# Patient Record
Sex: Male | Born: 1935 | Race: White | Marital: Married | Smoking: Never smoker
Health system: Southern US, Community
[De-identification: ages and names within clinical notes are randomized; demographics above are authoritative.]

## PROBLEM LIST (undated history)

## (undated) DIAGNOSIS — K5790 Diverticulosis of intestine, part unspecified, without perforation or abscess without bleeding: Secondary | ICD-10-CM

## (undated) DIAGNOSIS — H3530 Unspecified macular degeneration: Secondary | ICD-10-CM

## (undated) DIAGNOSIS — H8109 Meniere's disease, unspecified ear: Secondary | ICD-10-CM

## (undated) DIAGNOSIS — K2270 Barrett's esophagus without dysplasia: Secondary | ICD-10-CM

## (undated) DIAGNOSIS — K219 Gastro-esophageal reflux disease without esophagitis: Secondary | ICD-10-CM

## (undated) DIAGNOSIS — N419 Inflammatory disease of prostate, unspecified: Secondary | ICD-10-CM

## (undated) DIAGNOSIS — H269 Unspecified cataract: Secondary | ICD-10-CM

## (undated) DIAGNOSIS — K222 Esophageal obstruction: Secondary | ICD-10-CM

## (undated) DIAGNOSIS — I639 Cerebral infarction, unspecified: Secondary | ICD-10-CM

## (undated) DIAGNOSIS — H9319 Tinnitus, unspecified ear: Secondary | ICD-10-CM

## (undated) DIAGNOSIS — E785 Hyperlipidemia, unspecified: Secondary | ICD-10-CM

## (undated) DIAGNOSIS — H8309 Labyrinthitis, unspecified ear: Secondary | ICD-10-CM

## (undated) HISTORY — PX: TONSILLECTOMY: SUR1361

## (undated) HISTORY — DX: Unspecified macular degeneration: H35.30

## (undated) HISTORY — DX: Gastro-esophageal reflux disease without esophagitis: K21.9

## (undated) HISTORY — DX: Diverticulosis of intestine, part unspecified, without perforation or abscess without bleeding: K57.90

## (undated) HISTORY — DX: Labyrinthitis, unspecified ear: H83.09

## (undated) HISTORY — PX: COLONOSCOPY: SHX174

## (undated) HISTORY — DX: Meniere's disease, unspecified ear: H81.09

## (undated) HISTORY — PX: ESOPHAGOGASTRODUODENOSCOPY: SHX1529

## (undated) HISTORY — DX: Barrett's esophagus without dysplasia: K22.70

## (undated) HISTORY — DX: Hyperlipidemia, unspecified: E78.5

## (undated) HISTORY — DX: Inflammatory disease of prostate, unspecified: N41.9

## (undated) HISTORY — DX: Unspecified cataract: H26.9

## (undated) HISTORY — DX: Esophageal obstruction: K22.2

## (undated) HISTORY — DX: Cerebral infarction, unspecified: I63.9

## (undated) HISTORY — DX: Tinnitus, unspecified ear: H93.19

---

## 2017-10-14 ENCOUNTER — Encounter: Payer: Self-pay | Admitting: Internal Medicine

## 2017-11-26 ENCOUNTER — Encounter: Payer: Self-pay | Admitting: Internal Medicine

## 2017-11-26 ENCOUNTER — Ambulatory Visit (INDEPENDENT_AMBULATORY_CARE_PROVIDER_SITE_OTHER): Payer: Medicare Other | Admitting: Internal Medicine

## 2017-11-26 ENCOUNTER — Other Ambulatory Visit (INDEPENDENT_AMBULATORY_CARE_PROVIDER_SITE_OTHER): Payer: Medicare Other

## 2017-11-26 VITALS — BP 144/82 | HR 64 | Ht 68.25 in | Wt 174.0 lb

## 2017-11-26 DIAGNOSIS — G43A0 Cyclical vomiting, not intractable: Secondary | ICD-10-CM

## 2017-11-26 DIAGNOSIS — R29818 Other symptoms and signs involving the nervous system: Secondary | ICD-10-CM

## 2017-11-26 DIAGNOSIS — R2681 Unsteadiness on feet: Secondary | ICD-10-CM

## 2017-11-26 LAB — CBC WITH DIFFERENTIAL/PLATELET
Basophils Absolute: 0.1 10*3/uL (ref 0.0–0.1)
Basophils Relative: 1.3 % (ref 0.0–3.0)
Eosinophils Absolute: 0.7 10*3/uL (ref 0.0–0.7)
Eosinophils Relative: 7.6 % — ABNORMAL HIGH (ref 0.0–5.0)
HCT: 42.3 % (ref 39.0–52.0)
Hemoglobin: 14.7 g/dL (ref 13.0–17.0)
Lymphocytes Relative: 18.7 % (ref 12.0–46.0)
Lymphs Abs: 1.7 10*3/uL (ref 0.7–4.0)
MCHC: 34.7 g/dL (ref 30.0–36.0)
MCV: 90.6 fl (ref 78.0–100.0)
Monocytes Absolute: 0.6 10*3/uL (ref 0.1–1.0)
Monocytes Relative: 6.9 % (ref 3.0–12.0)
Neutro Abs: 6.1 10*3/uL (ref 1.4–7.7)
Neutrophils Relative %: 65.5 % (ref 43.0–77.0)
Platelets: 227 10*3/uL (ref 150.0–400.0)
RBC: 4.67 Mil/uL (ref 4.22–5.81)
RDW: 12.5 % (ref 11.5–15.5)
WBC: 9.3 10*3/uL (ref 4.0–10.5)

## 2017-11-26 LAB — COMPREHENSIVE METABOLIC PANEL
ALT: 13 U/L (ref 0–53)
AST: 14 U/L (ref 0–37)
Albumin: 4 g/dL (ref 3.5–5.2)
Alkaline Phosphatase: 72 U/L (ref 39–117)
BUN: 28 mg/dL — ABNORMAL HIGH (ref 6–23)
CO2: 32 mEq/L (ref 19–32)
Calcium: 9.8 mg/dL (ref 8.4–10.5)
Chloride: 101 mEq/L (ref 96–112)
Creatinine, Ser: 1.16 mg/dL (ref 0.40–1.50)
GFR: 63.69 mL/min (ref >60.00)
Glucose, Bld: 99 mg/dL (ref 70–99)
Potassium: 3.9 mEq/L (ref 3.5–5.1)
Sodium: 138 mEq/L (ref 135–145)
Total Bilirubin: 0.7 mg/dL (ref 0.2–1.2)
Total Protein: 7.1 g/dL (ref 6.0–8.3)

## 2017-11-26 NOTE — Patient Instructions (Addendum)
You have been scheduled for an endoscopy. Please follow written instructions given to you at your visit today. If you use inhalers (even only as needed), please bring them with you on the day of your procedure.   Your physician has requested that you go to the basement for the following lab work before leaving today: CBC/diff, CMET  You have been scheduled [facility] an [facility] on ____________. Your appointment time is ______________. Please arrive 20 minutes prior to your appointment time for registration purposes. Please make certain not to have anything to eat or drink 6 hours prior to your test. In addition, if you have any metal in your body, have a pacemaker or defibrillator, please be sure to let your ordering physician know. This test typically takes 45 minutes to 1 hour to complete. Should you need to reschedule, please call [phone] to do so.   I appreciate the opportunity to care for you. Stan Headarl Gessner, MD, [facility]   [facility] to reach out to patient to set up date/time.

## 2017-11-26 NOTE — Progress Notes (Signed)
Robert Gonzales 84 y.o. 08/17/1931 [id number]  Assessment & Plan:   Encounter Diagnoses  Name Primary?  . Cyclical vomiting with nausea, intractability of vomiting not specified Yes  . Gait instability   . Abnormal Romberg test   . Other symptoms and signs involving the nervous system     Suspect vomiting problems are not GI in origin but hx esophageal stricture and GERD with overall sxs - I think EGD reasonable.  Will also get MR/MRA brain to evaluate for vertebrobasilar insufficiency which may be a cause of episodic nausea and vomiting. Hx PAF - during cataract surgery - though Holter recently negative it is plausible that AF w/ decreased EF in setting of vertebrobasilar insufficiency could cause spells.  ENT does not think Meniere's cause (at least did not in 2017). W/ + romberg and gait instability ? If could have cerebellar problems  I appreciate the opportunity to care for this patient. CC: Clinton Sawyer, MD   Subjective:   Chief Complaint: Vomiting  HPI The patient is a very pleasant married 84 year old white man with his wife and daughter, [name] whom is a Engineer, civil (consulting) in our endoscopy center.  He has had chronic intermittent nausea and vomiting spells.  Originally thought to possibly have Mnire's, was treated with a steroid injection in the tympanic membrane at [facility] in 2007 and did well for a number of years.  Negative MR brain then. He is had about 12 spells of severe nausea and vomiting since December 2018 however.  In 2017 he saw ENT again and they thought he might have vertebrobasilar insufficiency.  He has not had testing done for that subsequently.  He was started on pentoxifylline .  He did have a recent Holter monitor and carotid ultrasound that were negative.  His daughter informs me that during cataract surgery in the past few years he did have an  episode of atrial fibrillation.  We do not have documentation that has happened since though she wonders.  He has a chronic  gait instability problem.  He says he has balance issues.No vertigo. Vision ok. Ears fell plugged up all the time. No dysphagia.No focal neuro complaints otherwise. Chronically hard of hearing.  He has a GI history with a question of Barrett's esophagus at one point he has had an esophageal stricture dilation before as well.  In 2014 he had an EGD and a 71 French Maloney dilation and no Barrett's esophagus as suspected previously.   Allergies  Allergen Reactions  . Sulfa Antibiotics Nausea Only   Current Meds  Medication Sig  . alum & mag hydroxide-simeth (MAALOX/MYLANTA) 200-200-20 MG/5ML suspension Take 30 mLs by mouth every 6 (six) hours as needed for indigestion or heartburn.  Marland Kitchen aspirin EC 81 MG tablet Take 1 tablet by mouth daily.  . cholecalciferol (VITAMIN D) 1000 units tablet Take 1 tablet by mouth daily.  . diazepam (VALIUM) 2 MG tablet Take by mouth.  . levocetirizine (XYZAL) 5 MG tablet Take 1 tablet by mouth daily.  Marland Kitchen omeprazole (PRILOSEC) 20 MG capsule Take 1 capsule by mouth daily.  . pentoxifylline (TRENTAL) 400 MG CR tablet Take 1 tablet by mouth 2 (two) times daily.  . promethazine (PHENERGAN) 25 MG tablet Take 25 mg by mouth every 6 (six) hours as needed for nausea or vomiting.  . vitamin B-12 (CYANOCOBALAMIN) 1000 MCG tablet Take 1 tablet by mouth daily.   Past Medical History:  Diagnosis Date  . Barrett's esophagus   . Diverticulosis  right and left colon  . Esophageal stricture    with Barrett's epithelium  . GERD (gastroesophageal reflux disease)   . Hearing loss   . Hyperlipidemia   . Labyrinthitis    recurrent  . Meniere disease   . Prostatitis   . Tinnitus    roaring   Past Surgical History:  Procedure Laterality Date  . COLONOSCOPY    . ESOPHAGOGASTRODUODENOSCOPY    . TONSILLECTOMY     Social History   Social History Narrative   Married, retired, 1 daughter - Ursula BeathKaren Tyrell, RN - [employer]   Worked for [employer]   None to rare EtOH   Not a smoker    family history includes Brain cancer in his brother; Healthy in his brother; Leukemia in his brother.   Review of Systems As per HPI  Objective:   Physical Exam @BP  (!) 144/82   Pulse 64   Ht 5' 8.25" (1.734 m)   Wt 174 lb (78.9 kg)   BMI 26.26 kg/m @  General:  Well-developed, well-nourished and in no acute distress Eyes:  anicteric. ENT:   Mouth and posterior pharynx free of lesions. + hearing aids and decreased hearing Neck:   supple w/o thyromegaly or mass.  Lungs: Clear to auscultation bilaterally. Heart:  S1S2, no rubs, murmurs, gallops. Abdomen:  soft, non-tender, no hepatosplenomegaly, hernia, or mass and BS+.  Lymph:  no cervical or supraclavicular adenopathy. Extremities:   no edema, cyanosis or clubbing Skin   no rash. Neuro:  A&O x 3. + Romberg, finger nose finger intact Psych:  appropriate mood and  Affect.   Data Reviewed: As per HPI I reviewed endoscopy notes, pathology reports and GI records from [place].  I reviewed [facility] notes and prior imaging .  Laboratory studies as well.  These will be scanned in.

## 2017-11-27 NOTE — Progress Notes (Signed)
I told daughter labs were ok

## 2017-12-16 ENCOUNTER — Ambulatory Visit
Admission: RE | Admit: 2017-12-16 | Discharge: 2017-12-16 | Disposition: A | Discharge status: Home / Self Care | Payer: Medicare Other | Source: Ambulatory Visit | Attending: Internal Medicine | Admitting: Internal Medicine

## 2017-12-16 DIAGNOSIS — R29818 Other symptoms and signs involving the nervous system: Secondary | ICD-10-CM

## 2017-12-16 MED ORDER — GADOBENATE DIMEGLUMINE 529 MG/ML IV SOLN
15.0000 mL | Freq: Once | INTRAVENOUS | Status: AC | PRN
  Administered 2017-12-16: 15 mL via INTRAVENOUS

## 2017-12-17 NOTE — Progress Notes (Signed)
Results given top daughter in person EGD is next (already scheduled)

## 2017-12-31 ENCOUNTER — Encounter: Payer: Self-pay | Admitting: Internal Medicine

## 2017-12-31 ENCOUNTER — Ambulatory Visit (AMBULATORY_SURGERY_CENTER): Payer: Medicare Other | Admitting: Internal Medicine

## 2017-12-31 ENCOUNTER — Other Ambulatory Visit: Payer: Self-pay

## 2017-12-31 VITALS — BP 141/79 | HR 54 | Temp 96.0°F | Resp 14 | Ht 68.25 in | Wt 174.0 lb

## 2017-12-31 DIAGNOSIS — G43A0 Cyclical vomiting, not intractable: Secondary | ICD-10-CM

## 2017-12-31 DIAGNOSIS — K259 Gastric ulcer, unspecified as acute or chronic, without hemorrhage or perforation: Secondary | ICD-10-CM | POA: Diagnosis not present

## 2017-12-31 DIAGNOSIS — K2960 Other gastritis without bleeding: Secondary | ICD-10-CM

## 2017-12-31 DIAGNOSIS — K2950 Unspecified chronic gastritis without bleeding: Secondary | ICD-10-CM

## 2017-12-31 MED ORDER — SODIUM CHLORIDE 0.9 % IV SOLN: 500.0000 mL | Freq: Once | INTRAVENOUS | Status: DC

## 2017-12-31 NOTE — Progress Notes (Signed)
Spontaneous respirations throughout. VSS. Resting comfortably. To PACU on room air. Report to  RN. 

## 2017-12-31 NOTE — Progress Notes (Signed)
Pt's esophagus was dilatated.  Per Dr. Leone PayorGessner no need for a dilatation diet. maw

## 2017-12-31 NOTE — Progress Notes (Signed)
Called to room to assist during endoscopic procedure.  Patient ID and intended procedure confirmed with present staff. Received instructions for my participation in the procedure from the performing physician.  

## 2017-12-31 NOTE — Progress Notes (Signed)
No problems noted in the recovery room. maw 

## 2017-12-31 NOTE — Op Note (Signed)
[name] Procedure Date: 12/31/2017 10:03 AM MRN: [id number] Endoscopist: Iva Booparl E Toshika Parrow , MD Age: 84 Referring MD:  Date of Birth: 10-Jan-1931 Gender: Male Account #: [id number] Procedure:                Upper GI endoscopy Indications:              Nausea with vomiting, Persistent vomiting of                            unknown cause Medicines:                Propofol per Anesthesia, Monitored Anesthesia Care Procedure:                Pre-Anesthesia Assessment:                           - Prior to the procedure, a History and Physical                            was performed, and patient medications and                            allergies were reviewed. The patient's tolerance of                            previous anesthesia was also reviewed. The risks                            and benefits of the procedure and the sedation                            options and risks were discussed with the patient.                            All questions were answered, and informed consent                            was obtained. Prior Anticoagulants: The patient has                            taken no previous anticoagulant or antiplatelet                            agents. ASA Grade Assessment: III - A patient with                            severe systemic disease. After reviewing the risks                            and benefits, the patient was deemed in                            satisfactory condition to undergo the procedure.  After obtaining informed consent, the endoscope was                            passed under direct vision. Throughout the                            procedure, the patient's blood pressure, pulse, and                            oxygen saturations were monitored continuously. The                            Endoscope was introduced through the mouth, and                            advanced to the  second part of duodenum. The upper                            GI endoscopy was accomplished without difficulty.                            The patient tolerated the procedure well. Scope In: Scope Out: Findings:                 One moderate benign-appearing, intrinsic stenosis                            was found at the gastroesophageal junction. And was                            traversed. A TTS dilator was passed through the                            scope. Dilation with an 18-19-20 mm balloon dilator                            was performed to 20 mm. The dilation site was                            examined and showed no change. Estimated blood                            loss: none.                           The Z-line was irregular and was found at the                            gastroesophageal junction.                           A 2 cm hiatal hernia was present.                           Multiple dispersed, small  non-bleeding erosions                            were found in the prepyloric region of the stomach.                            There were no stigmata of recent bleeding. Biopsies                            were taken with a cold forceps for histology.                            Verification of patient identification for the                            specimen was done. Estimated blood loss was minimal.                           A single 6 mm submucosal papule (nodule) was found                            in the gastric antrum. Biopsies were taken with a                            cold forceps for histology. Verification of patient                            identification for the specimen was done. Estimated                            blood loss was minimal.                           The exam was otherwise without abnormality.                           The cardia and gastric fundus were normal on                            retroflexion. Complications:            No  immediate complications. Estimated Blood Loss:     Estimated blood loss was minimal. Impression:               - Benign-appearing esophageal stenosis. Dilated. No                            effect so do not think related to sxs - probably                            "muscular ring" vs hiatal hernia effect on GE                            junction appearance.                           -  Z-line irregular, at the gastroesophageal                            junction.                           - 2 cm hiatal hernia.                           - Non-bleeding erosive gastropathy. Biopsied.                           - A single submucosal papule (nodule) found in the                            stomach. Erosion on top. apperas benign. Biopsied.                           - The examination was otherwise normal. Recommendation:           - Patient has a contact number available for                            emergencies. The signs and symptoms of potential                            delayed complications were discussed with the                            patient. Return to normal activities tomorrow.                            Written discharge instructions were provided to the                            patient.                           - Resume previous diet.                           - Continue present medications.                           - Await pathology results. May need higher dose PPI                           - Anticipate having hm see cardiology and neurology                            - await path review and discussion. Seems unlikely                            that fndings here explain all of problems. Iva Boop, MD 12/31/2017 10:44:37 AM This report has been signed electronically.

## 2017-12-31 NOTE — Patient Instructions (Addendum)
I found some inflammation in the stomach - "erosive gastritis" - biopsies taken. Also saw a lump or nodule in the stomach - looks benign - biopsied also.  There was a possible stricture where esophagus and stomach meet and I stretched that - also saw small hiatal hernia.  All else ok.  Await biopsy results and will make recommendations. I do think seeing neurology and cardiology makes sense.  I appreciate the opportunity to care for you. Iva Booparl E. Amarilis Belflower, MD, FACG       YOU HAD AN ENDOSCOPIC PROCEDURE TODAY AT THE [facility]:   Refer to the procedure report that was given to you for any specific questions about what was found during the examination.  If the procedure report does not answer your questions, please call your gastroenterologist to clarify.  If you requested that your care partner not be given the details of your procedure findings, then the procedure report has been included in a sealed envelope for you to review at your convenience later.  YOU SHOULD EXPECT: Some feelings of bloating in the abdomen. Passage of more gas than usual.  Walking can help get rid of the air that was put into your GI tract during the procedure and reduce the bloating. If you had a lower endoscopy (such as a colonoscopy or flexible sigmoidoscopy) you may notice spotting of blood in your stool or on the toilet paper. If you underwent a bowel prep for your procedure, you may not have a normal bowel movement for a few days.  Please Note:  You might notice some irritation and congestion in your nose or some drainage.  This is from the oxygen used during your procedure.  There is no need for concern and it should clear up in a day or so.  SYMPTOMS TO REPORT IMMEDIATELY:   Following upper endoscopy (EGD)  Vomiting of blood or coffee ground material  New chest pain or pain under the shoulder blades  Painful or persistently difficult swallowing  New shortness of breath  Fever of  100F or higher  Black, tarry-looking stools  For urgent or emergent issues, a gastroenterologist can be reached at any hour by calling [phone].   DIET:  We do recommend a small meal at first, but then you may proceed to your regular diet.  Drink plenty of fluids but you should avoid alcoholic beverages for 24 hours.  ACTIVITY:  You should plan to take it easy for the rest of today and you should NOT DRIVE or use heavy machinery until tomorrow (because of the sedation medicines used during the test).    FOLLOW UP: Our staff will call the number listed on your records the next business day following your procedure to check on you and address any questions or concerns that you may have regarding the information given to you following your procedure. If we do not reach you, we will leave a message.  However, if you are feeling well and you are not experiencing any problems, there is no need to return our call.  We will assume that you have returned to your regular daily activities without incident.  If any biopsies were taken you will be contacted by phone or by letter within the next 1-3 weeks.  Please call us at [phone] if you have not heard about the biopsies in 3 weeks.    SIGNATURES/CONFIDENTIALITY: You and/or your care partner have signed paperwork which will be entered into your electronic medical record.  These signatures attest to the fact that that the information above on your After Visit Summary has been reviewed and is understood.  Full responsibility of the confidentiality of this discharge information lies with you and/or your care-partner.   Handouts were given to your care partner on GERD, a hiatal hernia and gastritis. You may resume your current medications today. Await biopsy results. Please call if any questions or concerns.

## 2018-01-01 ENCOUNTER — Telehealth: Payer: Self-pay | Admitting: *Deleted

## 2018-01-01 ENCOUNTER — Telehealth: Payer: Self-pay

## 2018-01-01 DIAGNOSIS — R42 Dizziness and giddiness: Secondary | ICD-10-CM

## 2018-01-01 DIAGNOSIS — R269 Unspecified abnormalities of gait and mobility: Principal | ICD-10-CM

## 2018-01-01 DIAGNOSIS — I639 Cerebral infarction, unspecified: Secondary | ICD-10-CM

## 2018-01-01 DIAGNOSIS — I69398 Other sequelae of cerebral infarction: Secondary | ICD-10-CM

## 2018-01-01 DIAGNOSIS — R112 Nausea with vomiting, unspecified: Secondary | ICD-10-CM

## 2018-01-01 NOTE — Telephone Encounter (Signed)
-----   Message from Iva Booparl E Gessner, MD sent at 01/01/2018  9:18 AM EDT ----- Regarding: neuro referral Ursula BeathKaren Tyrell RN - [employer] - her father  Needs neuro referral re:  Hx remote right occipital stroke, gait instability, dizziness and episodic vomiting  Might need to check with Clydie BraunKaren re: when but suspect dates so far out will not matter  Please let me know who appt is with and when  Thanks

## 2018-01-01 NOTE — Telephone Encounter (Signed)
  Follow up Call-  Call back number 12/31/2017  Post procedure Call Back phone  # [phone]  Permission to leave phone message Yes  Some recent data might be hidden     Patient questions:  Do you have a fever, pain , or abdominal swelling? No. Pain Score  0 *  Have you tolerated food without any problems? Yes.    Have you been able to return to your normal activities? Yes.    Do you have any questions about your discharge instructions: Diet   No. Medications  No. Follow up visit  No.  Do you have questions or concerns about your Care? No.  Actions: * If pain score is 4 or above: No action needed, pain <4.

## 2018-01-01 NOTE — Telephone Encounter (Signed)
Referral placed I left a message for his daughter Clydie Braunkaren that he will be contacted directly with an appt date and time for the referral.

## 2018-01-03 ENCOUNTER — Encounter: Payer: Self-pay | Admitting: Neurology

## 2018-01-03 ENCOUNTER — Other Ambulatory Visit: Payer: Self-pay | Admitting: Internal Medicine

## 2018-01-03 DIAGNOSIS — K2990 Gastroduodenitis, unspecified, without bleeding: Secondary | ICD-10-CM

## 2018-01-03 MED ORDER — PANTOPRAZOLE SODIUM 40 MG PO TBEC: 40.0000 mg | DELAYED_RELEASE_TABLET | Freq: Every day | ORAL | 3 refills | Status: AC

## 2018-01-03 NOTE — Telephone Encounter (Signed)
appt has been scheduled with Dr. Adriana MccallumJaffee for 04/29/18

## 2018-01-03 NOTE — Progress Notes (Signed)
Results given to daughter. Plan is to change PPI to pantoprazole 40 mg qd # 90 3 RF sent No recall Would avoid Eliquis at this time - neuro and possible cardiology evaluations pending

## 2018-01-31 ENCOUNTER — Encounter: Payer: Self-pay | Admitting: Neurology

## 2018-02-11 ENCOUNTER — Other Ambulatory Visit: Payer: Self-pay | Admitting: Internal Medicine

## 2018-02-11 NOTE — Progress Notes (Signed)
Medication update.

## 2018-02-21 ENCOUNTER — Ambulatory Visit: Payer: Medicare Other | Admitting: Cardiology

## 2018-04-23 ENCOUNTER — Ambulatory Visit: Payer: Medicare Other | Admitting: Cardiology

## 2018-04-29 ENCOUNTER — Ambulatory Visit: Payer: Medicare Other | Admitting: Neurology

## 2018-04-29 ENCOUNTER — Encounter

## 2018-05-05 ENCOUNTER — Encounter: Payer: Self-pay | Admitting: Neurology

## 2018-05-05 ENCOUNTER — Ambulatory Visit (INDEPENDENT_AMBULATORY_CARE_PROVIDER_SITE_OTHER): Payer: Medicare Other | Admitting: Neurology

## 2018-05-05 ENCOUNTER — Other Ambulatory Visit: Payer: Self-pay

## 2018-05-05 VITALS — BP 118/78 | HR 63 | Ht 68.0 in | Wt 166.0 lb

## 2018-05-05 DIAGNOSIS — R42 Dizziness and giddiness: Secondary | ICD-10-CM | POA: Diagnosis not present

## 2018-05-05 DIAGNOSIS — H8190 Unspecified disorder of vestibular function, unspecified ear: Secondary | ICD-10-CM

## 2018-05-05 DIAGNOSIS — R2681 Unsteadiness on feet: Secondary | ICD-10-CM

## 2018-05-05 DIAGNOSIS — I679 Cerebrovascular disease, unspecified: Secondary | ICD-10-CM

## 2018-05-05 DIAGNOSIS — I639 Cerebral infarction, unspecified: Secondary | ICD-10-CM | POA: Diagnosis not present

## 2018-05-05 NOTE — Progress Notes (Signed)
NEUROLOGY CONSULTATION NOTE  Robert Gonzales MRN: [id number] DOB: 03-Feb-1931  Referring provider: Stan Headarl Gessner, MD Primary care provider: Kizzie BaneMinesh Shah, MD  Reason for consult:  Dizziness, stroke  HISTORY OF PRESENT ILLNESS: Robert Gonzales is an 84 year old right-handed male who presents for dizziness, episodic vomiting and history of stroke.  He is accompanied by his wife and daughter who supplements history.  History is also supplemented by referring provider's note.  He has history of intermittent dizziness, diaphoresis, nausea and vomiting for over 10 years.  He was evaluated by [facility]NT [facility] in 2007 and was treated with intratympanic steroids for suspected Meniere disease, which was effective for several years.  He started experiencing the spells again in 2017.  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  He describes the dizziness as acute episodes of feeling off-balance but denies spinning sensation.  He denies associated headache, visual disturbance or unilateral numbness or weakness.  They typically last 3 hours.  They tend to be cyclic, occurring in the spring or fall.  He hasn't had an episode in about a month.  He has associated hearing loss, tinnitus and unsteady gait but no falls.  His daughter reports that he briefly was in atrial fibrillation during cataract surgery 4-5 years ago.  Last summer, he had an episode of double vision.  He saw his ophthalmologist who prescribed an eye patch and symptoms resolved after 3 weeks.  No associated lateralized symptoms.  In January of this year, he had a Holter monitor which showed some asymptomatic PVCs but otherwise no significant arrhythmia.  Carotid doppler at that time showed mild plaque within the right internal carotid artery and no hemodynamically significant stenosis in both internal carotid arteries.  MRI of brain with and  without contrast from 12/16/17 was personally reviewed and demonstrated generalized atrophy and chronic small vessel ischemic changes in the cerebral hemispheres as well as the pons and cerebellum, including remote right occipital lobe infarct but no acute findings or posterior fossa mass lesions.  He underwent upper endoscopy on 12/31/17 which demonstrated stenosis at the gastroesophageal junction and multiple small non-bleeding ulcers in the prepyloric region of the stomach.  MRA of head from 01/31/18 showed mild narrowing of the proximal left PCA and minimal narrowing of the right supraclinoid ICA but otherwise no significant large vessel stenosis or occlusion.  He denies history of migraines or other headaches. He has sensorineural hearing loss.  PAST MEDICAL HISTORY: Past Medical History:  Diagnosis Date  . Barrett's esophagus   . Cataract    Bilateral eyes in 2014  . Diverticulosis    right and left colon  . Esophageal stricture    with Barrett's epithelium  . GERD (gastroesophageal reflux disease)   . Hearing loss   . Hyperlipidemia   . Labyrinthitis    recurrent  . Macular degeneration   . Meniere disease   . Prostatitis   . Stroke [facility](HCC)    Based off of an MRI, MD told pt it seemed as if he had a previous stroke  . Tinnitus    roaring    PAST SURGICAL HISTORY: Past Surgical History:  Procedure Laterality Date  . COLONOSCOPY    . ESOPHAGOGASTRODUODENOSCOPY    . TONSILLECTOMY      MEDICATIONS: Current Outpatient Medications on File Prior to Visit  Medication Sig Dispense Refill  . alum & mag hydroxide-simeth (MAALOX/MYLANTA) 200-200-20 MG/5ML  suspension Take 30 mLs by mouth every 6 (six) hours as needed for indigestion or heartburn.    Marland Kitchen aspirin EC 81 MG tablet Take 1 tablet by mouth daily.    . cholecalciferol (VITAMIN D) 1000 units tablet Take 1 tablet by mouth daily.    . diazepam (VALIUM) 2 MG tablet Take by mouth.    . levocetirizine (XYZAL) 5 MG tablet Take 1  tablet by mouth daily.    . pantoprazole (PROTONIX) 40 MG tablet Take 1 tablet (40 mg total) by mouth daily before breakfast. 90 tablet 3  . pentoxifylline (TRENTAL) 400 MG CR tablet Take 1 tablet by mouth 2 (two) times daily.    . promethazine (PHENERGAN) 25 MG tablet Take 25 mg by mouth every 6 (six) hours as needed for nausea or vomiting.    . vitamin B-12 (CYANOCOBALAMIN) 1000 MCG tablet Take 1 tablet by mouth daily.     No current facility-administered medications on file prior to visit.     ALLERGIES: Allergies  Allergen Reactions  . Sulfa Antibiotics Nausea Only    FAMILY HISTORY: Family History  Problem Relation Age of Onset  . Healthy Brother   . Leukemia Brother   . Brain cancer Brother   . Melanoma Mother   . High blood pressure Mother   . High blood pressure Father   . Breast cancer Daughter   . High blood pressure Daughter   . High Cholesterol Daughter   . Melanoma Daughter   . Colon cancer Neg Hx   . Stomach cancer Neg Hx   . Esophageal cancer Neg Hx     SOCIAL HISTORY: Social History   Socioeconomic History  . Marital status: Married    Spouse name: Robert Gonzales  . Number of children: 1  . Years of education: Not on file  . Highest education level: 12th grade  Occupational History  . Occupation: retired  Engineer, production  . Financial resource strain: Not on file  . Food insecurity:    Worry: Not on file    Inability: Not on file  . Transportation needs:    Medical: Not on file    Non-medical: Not on file  Tobacco Use  . Smoking status: Never Smoker  . Smokeless tobacco: Never Used  Substance and Sexual Activity  . Alcohol use: Yes    Comment: none to rare  . Drug use: No  . Sexual activity: Not on file  Lifestyle  . Physical activity:    Days per week: Not on file    Minutes per session: Not on file  . Stress: Not on file  Relationships  . Social connections:    Talks on phone: Not on file    Gets together: Not on file    Attends religious  service: Not on file    Active member of club or organization: Not on file    Attends meetings of clubs or organizations: Not on file    Relationship status: Not on file  . Intimate partner violence:    Fear of current or ex partner: Not on file    Emotionally abused: Not on file    Physically abused: Not on file    Forced sexual activity: Not on file  Other Topics Concern  . Not on file  Social History Narrative   Married, retired, 1 daughter - Ursula Beath, RN - [employer]   Worked for [employer]   None to rare EtOH   Not a smoker  Patient is right-handed. He lives with his wife in a one story house with a basement. He drinks one cup of coffee and 2 glasses of tea or soda a day. He is active with yardwork.    REVIEW OF SYSTEMS: Constitutional: No fevers, chills, or sweats, no generalized fatigue, change in appetite Eyes: No visual changes, double vision, eye pain Ear, nose and throat: No hearing loss, ear pain, nasal congestion, sore throat Cardiovascular: No chest pain, palpitations Respiratory:  No shortness of breath at rest or with exertion, wheezes GastrointestinaI: No nausea, vomiting, diarrhea, abdominal pain, fecal incontinence Genitourinary:  No dysuria, urinary retention or frequency Musculoskeletal:  No neck pain, back pain Integumentary: No rash, pruritus, skin lesions Neurological: as above Psychiatric: No depression, insomnia, anxiety Endocrine: No palpitations, fatigue, diaphoresis, mood swings, change in appetite, change in weight, increased thirst Hematologic/Lymphatic:  No purpura, petechiae. Allergic/Immunologic: no itchy/runny eyes, nasal congestion, recent allergic reactions, rashes  PHYSICAL EXAM: Vitals:   05/05/18 0910  BP: 118/78  Pulse: 63  SpO2: 98%   General: No acute distress.  Patient appears well-groomed.  Head:  Normocephalic/atraumatic Eyes:  fundi examined but not visualized Neck: supple, no paraspinal tenderness, full range of motion Back:  No paraspinal tenderness Heart: regular rate and rhythm Lungs: Clear to auscultation bilaterally. Vascular: No carotid bruits. Neurological Exam: Mental status: alert and oriented to person, place, and time, recent and remote memory intact, fund of knowledge intact, attention and concentration intact, speech fluent and not dysarthric, language intact. Cranial nerves: CN I: not tested CN II: pupils equal, round and reactive to light, visual fields intact CN III, IV, VI:  full range of motion, no nystagmus, no ptosis CN V: facial sensation intact CN VII: upper and lower face symmetric CN VIII: hearing intact CN IX, X: gag intact, uvula midline CN XI: sternocleidomastoid and trapezius muscles intact CN XII: tongue midline Bulk & Tone: normal, no fasciculations. Motor:  5/5 throughout  Sensation:  Pinprick sensation intact; vibration sensation reduced in feet. Deep Tendon Reflexes:  2+ throughout except absent in ankles, toes downgoing.  Finger to nose testing:  Without dysmetria.  Heel to shin:  Without dysmetria.  Gait:  Mildly wide-based and cautious gait..  After turning, he was briefly off-balance.  Unable to tandem walk.  Romberg positive.  IMPRESSION: 1.  Episodic dizziness.  Unclear etiology.  Given the associated tinnitus, as well as positive response to intratympanic steroids, Meniere's seems likely. However, it would depend if audiometric testing demonstrated low-frequency hearing loss.  Consider vestibular migraines, however he has no associated headache.  While one may not have associated headache, it is also not supported by prior history of headaches.  It is not vertebrobasilar insufficiency.   2.  Cerebrovascular disease with incidental chronic right occipital lobe infarct. 3.  Unsteady gait.  May be related to peripheral neuropathy or vestibulopathy.    PLAN: 1.  For balance and dizziness, vestibular rehab/physical therapy is best option 2.  His daughter will send me a  copy of his last audiometric testing.  If symptoms persist, we can always try a medication such as topiramate. 3.  Optimize stroke risk factors:  Continue aspirin daily, statin therapy and blood pressure control. 4.  Follow up as needed  Thank you for allowing me to take part in the care of this patient.  Shon Millet, DO  CC: Robert Bane, MD  Robert Head, MD

## 2018-05-05 NOTE — Patient Instructions (Signed)
1.  We will send a prescription for unsteady gait, so they can work on improving your balance and walking as well as your dizziness.

## 2018-06-03 NOTE — Progress Notes (Signed)
Cardiology Office Note   Date:  06/04/2018   ID:  Robert QuaJohn Xia, DOB Oct 17, 1930, MRN [id number]  PCP:  Clinton SawyerShah, Minesh R, MD  Cardiologist:   No primary care provider on file. Referring:  Dr. Leone PayorGessner  Chief Complaint  Patient presents with  . Dizziness      History of Present Illness: Robert Gonzales is a 84 y.o. male who is referred by Dr. Leone PayorGessner for evaluation of atrial fib noted by Dr. Leone PayorGessner.  He presents for follow-up of dizzy spells.  He does not have any past cardiac history.  He said the dizzy spells for some time.  There was some mention many years ago when he had cataract surgery he had atrial fibrillation but there was no indication for anticoagulation and this was never seen in follow-up.  He has been having dizzy spells happening about every other month.  He said his gait is always off but it gets worse suddenly.  He always has decreased exercise tolerance but it gets worse suddenly.  He develops nausea and vomiting that might last for hours.  He is had an EGD that was unremarkable.  He seen a neurologist and had MRI MRA and CT.  He was treated for many years but it was not clear that he had this.  It was not a clear etiology to the spells.  He could be several weeks before he has 1 of these.  He does not necessarily feel palpitations.  He has not lost consciousness.  He does not have any shortness of breath, PND or orthopnea.  He does not do much because of decreased exercise tolerance.  I do note that he has deep T wave inversions on his EKG but I do not have any old EKG for comparison.  He has not had any prior stress testing or echocardiography.  I did find a Holter monitor that he wore recently and this demonstrated no atrial fibrillation or other arrhythmias.  Past Medical History:  Diagnosis Date  . Barrett's esophagus   . Cataract    Bilateral eyes in 2014  . Diverticulosis    right and left colon  . Esophageal stricture    with Barrett's epithelium  . GERD  (gastroesophageal reflux disease)   . Hyperlipidemia   . Labyrinthitis    recurrent  . Macular degeneration   . Meniere disease   . Prostatitis   . Stroke [facility](HCC)    Based off of an MRI, MD told pt it seemed as if he had a previous stroke  . Tinnitus    roaring    Past Surgical History:  Procedure Laterality Date  . COLONOSCOPY    . ESOPHAGOGASTRODUODENOSCOPY    . TONSILLECTOMY       Current Outpatient Medications  Medication Sig Dispense Refill  . alum & mag hydroxide-simeth (MAALOX/MYLANTA) 200-200-20 MG/5ML suspension Take 30 mLs by mouth every 6 (six) hours as needed for indigestion or heartburn.    Marland Kitchen. aspirin EC 81 MG tablet Take 1 tablet by mouth daily.    . cholecalciferol (VITAMIN D) 1000 units tablet Take 1 tablet by mouth daily.    . diazepam (VALIUM) 2 MG tablet Take 2 mg by mouth. Take one tablet by mouth  as needed    . levocetirizine (XYZAL) 5 MG tablet Take 1 tablet by mouth daily.    . metoCLOPramide (REGLAN) 5 MG tablet   5  . pentoxifylline (TRENTAL) 400 MG CR tablet Take 1 tablet by mouth 2 (two) times  daily.    . promethazine (PHENERGAN) 25 MG tablet Take 25 mg by mouth every 6 (six) hours as needed for nausea or vomiting.    . pantoprazole (PROTONIX) 40 MG tablet Take 1 tablet (40 mg total) by mouth daily before breakfast. (Patient not taking: Reported on 06/04/2018) 90 tablet 3   No current facility-administered medications for this visit.     Allergies:   Sulfa antibiotics    Social History:  The patient  reports that he has never smoked. He has never used smokeless tobacco. He reports that he drinks alcohol. He reports that he does not use drugs.   Family History:  The patient's family history includes Brain cancer in his brother; Breast cancer in his daughter; Healthy in his brother; High Cholesterol in his daughter; High blood pressure in his daughter, father, and mother; Leukemia in his brother; Melanoma in his daughter and mother.    ROS:  Please  see the history of present illness.   Otherwise, review of systems are positive for none.   All other systems are reviewed and negative.    PHYSICAL EXAM: VS:  BP (!) 152/80   Pulse (!) 53   Ht 5' 8.5" (1.74 m)   Wt 164 lb (74.4 kg)   BMI 24.57 kg/m  , BMI Body mass index is 24.57 kg/m. GENERAL:  Well appearing HEENT:  Pupils equal round and reactive, fundi not visualized, oral mucosa unremarkable NECK:  No jugular venous distention, waveform within normal limits, carotid upstroke brisk and symmetric, no bruits, no thyromegaly LYMPHATICS:  No cervical, inguinal adenopathy LUNGS:  Clear to auscultation bilaterally BACK:  No CVA tenderness CHEST:  Unremarkable HEART:  PMI not displaced or sustained,S1 and S2 within normal limits, no S3, no S4, no clicks, no rubs, no murmurs ABD:  Flat, positive bowel sounds normal in frequency in pitch, no bruits, no rebound, no guarding, no midline pulsatile mass, no hepatomegaly, no splenomegaly EXT:  2 plus pulses throughout, no edema, no cyanosis no clubbing SKIN:  No rashes no nodules NEURO:  Cranial nerves II through XII grossly intact, motor grossly intact throughout PSYCH:  Cognitively intact, oriented to person place and time    EKG:  EKG is ordered today. The ekg ordered today demonstrates sinus rhythm, rate 53, axis within normal limits, intervals within normal limits, deep anterior lateral T wave inversions without old EKGs for comparison.   Recent Labs: 11/26/2017: ALT 13; BUN 28; Creatinine, Ser 1.16; Hemoglobin 14.7; Platelets 227.0; Potassium 3.9; Sodium 138    Lipid Panel No results found for: CHOL, TRIG, HDL, CHOLHDL, VLDL, LDLCALC, LDLDIRECT    Wt Readings from Last 3 Encounters:  06/04/18 164 lb (74.4 kg)  05/05/18 166 lb (75.3 kg)  12/31/17 174 lb (78.9 kg)      Other studies Reviewed: Additional studies/ records that were reviewed today include: Neurology records.  Holter Review of the above records demonstrates:   Please see elsewhere in the note.     ASSESSMENT AND PLAN:  ATRIAL FIB:    I do not know that there is any association between this and the symptoms that he is having for that he is even having recurrent fibrillation.  I would like him to wear an event monitor.  This should capture 1 of his "spells".  I also advised his daughter who is a nurse with Dr. Leone Payor to get an [employer]  ABNORMAL EKG:  I will check an echo and have a low threshold for a stress  test.     Current medicines are reviewed at length with the patient today.  The patient does not have concerns regarding medicines.  The following changes have been made:  no change  Labs/ tests ordered today include:   Orders Placed This Encounter  Procedures  . CARDIAC EVENT MONITOR  . EKG 12-Lead  . ECHOCARDIOGRAM COMPLETE     Disposition:   FU with me after the event monitor.      Signed, Rollene Rotunda, MD  06/04/2018 12:22 PM    [facility]

## 2018-06-04 ENCOUNTER — Encounter: Payer: Self-pay | Admitting: Cardiology

## 2018-06-04 ENCOUNTER — Ambulatory Visit (INDEPENDENT_AMBULATORY_CARE_PROVIDER_SITE_OTHER): Payer: Medicare Other | Admitting: Cardiology

## 2018-06-04 VITALS — BP 152/80 | HR 53 | Ht 68.5 in | Wt 164.0 lb

## 2018-06-04 DIAGNOSIS — I480 Paroxysmal atrial fibrillation: Secondary | ICD-10-CM

## 2018-06-04 DIAGNOSIS — R42 Dizziness and giddiness: Secondary | ICD-10-CM | POA: Insufficient documentation

## 2018-06-04 DIAGNOSIS — I639 Cerebral infarction, unspecified: Secondary | ICD-10-CM

## 2018-06-04 DIAGNOSIS — R9431 Abnormal electrocardiogram [ECG] [EKG]: Secondary | ICD-10-CM | POA: Insufficient documentation

## 2018-06-04 NOTE — Patient Instructions (Signed)
Medication Instructions:  The current medical regimen is effective;  continue present plan and medications.  Testing/Procedures: Your physician has requested that you have an echocardiogram. Echocardiography is a painless test that uses sound waves to create images of your heart. It provides your doctor with information about the size and shape of your heart and how well your heart's chambers and valves are working. This procedure takes approximately one hour. There are no restrictions for this procedure.  Your physician has recommended that you wear an event monitor for 30 days. Event monitors are medical devices that record the heart's electrical activity. Doctors most often us these monitors to diagnose arrhythmias. Arrhythmias are problems with the speed or rhythm of the heartbeat. The monitor is a small, portable device. You can wear one while you do your normal daily activities. This is usually used to diagnose what is causing palpitations/syncope (passing out).  Follow-Up: Follow up with Dr Antoine PocheHochrein after the above testing.  If you need a refill on your cardiac medications before your next appointment, please call your pharmacy.  Thank you for choosing [facility]!!

## 2018-06-10 ENCOUNTER — Ambulatory Visit (HOSPITAL_COMMUNITY): Payer: Medicare Other | Attending: Cardiology

## 2018-06-10 ENCOUNTER — Other Ambulatory Visit: Payer: Self-pay

## 2018-06-10 ENCOUNTER — Ambulatory Visit (INDEPENDENT_AMBULATORY_CARE_PROVIDER_SITE_OTHER): Payer: Medicare Other

## 2018-06-10 ENCOUNTER — Other Ambulatory Visit: Payer: Self-pay | Admitting: Cardiology

## 2018-06-10 DIAGNOSIS — E785 Hyperlipidemia, unspecified: Secondary | ICD-10-CM | POA: Insufficient documentation

## 2018-06-10 DIAGNOSIS — R42 Dizziness and giddiness: Secondary | ICD-10-CM

## 2018-06-10 DIAGNOSIS — Z8673 Personal history of transient ischemic attack (TIA), and cerebral infarction without residual deficits: Secondary | ICD-10-CM | POA: Insufficient documentation

## 2018-06-10 DIAGNOSIS — R9431 Abnormal electrocardiogram [ECG] [EKG]: Secondary | ICD-10-CM | POA: Insufficient documentation

## 2018-06-10 DIAGNOSIS — I480 Paroxysmal atrial fibrillation: Secondary | ICD-10-CM

## 2018-06-10 DIAGNOSIS — I429 Cardiomyopathy, unspecified: Secondary | ICD-10-CM | POA: Diagnosis not present

## 2018-06-19 ENCOUNTER — Telehealth: Payer: Self-pay | Admitting: *Deleted

## 2018-06-19 DIAGNOSIS — R9431 Abnormal electrocardiogram [ECG] [EKG]: Secondary | ICD-10-CM

## 2018-06-19 DIAGNOSIS — R42 Dizziness and giddiness: Secondary | ICD-10-CM

## 2018-06-19 DIAGNOSIS — I480 Paroxysmal atrial fibrillation: Secondary | ICD-10-CM

## 2018-06-19 NOTE — Telephone Encounter (Signed)
Notes recorded by Rollene RotundaHochrein, James, MD on 06/13/2018 at 12:18 PM EDT No significant abnormalities on the echo although there is some LVH which might explain the EKG changes. I would like for him to have a Lexiscan Myoview given the abnormal EKG. Please call and arrange. Call Mr. Robert Gonzales with the results and send results to Robert Gonzales, Robert R, MD   Reviewed results of testing with pt and the need for further testing.  Pt states understanding and is agreeable.  He would however like for his daughter to c/b to discuss the result and testing needed.  She will also be who is driving him for the testing and will need to schedule the myoview through her.  Gave patient the phone # and my name to have daughter to c/b.

## 2018-06-20 NOTE — Telephone Encounter (Signed)
Follow up: ° ° °Patient returning call back °

## 2018-06-20 NOTE — Telephone Encounter (Signed)
Spoke with daughter Clydie BraunKaren at pt's requested.  Reviewed with her the need for further testing d/t abn ekg and LVH on echo.  Reviewed instructions with her.  Order placed in Epic and she is aware she will be called to schedule lexiscan.  (her phone # and request to contact her to schedule was added to order).

## 2018-06-28 ENCOUNTER — Telehealth: Payer: Self-pay | Admitting: Cardiovascular Disease

## 2018-06-28 NOTE — Telephone Encounter (Signed)
Telephone Note  Called by [employer] regarding patient's event monitor.  At 8:24 pm patient had 6 seconds of WCT at 180 bpm.  [employer] called the patient, and he was asymptomatic at the time.  Patient has normal LV EF and is scheduled for Stress test on 07/08/2018.  No other evaluation needed at this point.  Can consider beta blocker.  Carlye GrippeAdam Barnett, MD Cardiology

## 2018-07-03 ENCOUNTER — Telehealth (HOSPITAL_COMMUNITY): Payer: Self-pay

## 2018-07-03 NOTE — Telephone Encounter (Signed)
Encounter complete. 

## 2018-07-08 ENCOUNTER — Ambulatory Visit (HOSPITAL_COMMUNITY)
Admission: RE | Admit: 2018-07-08 | Discharge: 2018-07-08 | Disposition: A | Discharge status: Home / Self Care | Payer: Medicare Other | Source: Ambulatory Visit | Attending: Cardiovascular Disease | Admitting: Cardiovascular Disease

## 2018-07-08 DIAGNOSIS — R42 Dizziness and giddiness: Secondary | ICD-10-CM

## 2018-07-08 DIAGNOSIS — I480 Paroxysmal atrial fibrillation: Secondary | ICD-10-CM | POA: Insufficient documentation

## 2018-07-08 DIAGNOSIS — R9431 Abnormal electrocardiogram [ECG] [EKG]: Secondary | ICD-10-CM | POA: Insufficient documentation

## 2018-07-08 LAB — MYOCARDIAL PERFUSION IMAGING
Peak HR: 81 {beats}/min
Rest HR: 53 {beats}/min

## 2018-07-08 MED ORDER — TECHNETIUM TC 99M TETROFOSMIN IV KIT
29.4000 | PACK | Freq: Once | INTRAVENOUS | Status: DC | PRN
  Filled 2018-07-08: qty 30

## 2018-07-08 MED ORDER — AMINOPHYLLINE 25 MG/ML IV SOLN: 75.0000 mg | Freq: Once | INTRAVENOUS | Status: DC

## 2018-07-08 MED ORDER — TECHNETIUM TC 99M TETROFOSMIN IV KIT
10.4000 | PACK | Freq: Once | INTRAVENOUS | Status: DC | PRN
  Filled 2018-07-08: qty 11

## 2018-07-08 MED ORDER — REGADENOSON 0.4 MG/5ML IV SOLN: 0.4000 mg | Freq: Once | INTRAVENOUS | Status: DC

## 2018-07-22 NOTE — Progress Notes (Signed)
Cardiology Office Note   Date:  07/23/2018   ID:  [name], DOB 14-May-1931, MRN [id number]  PCP:  Clinton Sawyer, MD  Cardiologist:   No primary care provider on file. Referring:  Dr. Leone Payor  Chief Complaint  Patient presents with  . Dizziness      History of Present Illness: Robert Gonzales is a 84 y.o. male who is referred by Dr. Leone Payor for evaluation of atrial fib noted by Dr. Leone Payor.  However, when I reviewed a Holter I could not find evidence of atrial fib.  An echo after my first visit with him demonstrated LVH.    Perfusion study was negative for ischemia.   He did have NSVT on event monitor.     Since I last saw him he had manipulation for vertigo and he seems to be much better.  He has not had any nausea or vomiting episodes.  He has no shortness of breath, PND orthopnea.  He has no palpitations, presyncope or syncope.  He is been feeling much better.   Past Medical History:  Diagnosis Date  . Barrett's esophagus   . Cataract    Bilateral eyes in 2014  . Diverticulosis    right and left colon  . Esophageal stricture    with Barrett's epithelium  . GERD (gastroesophageal reflux disease)   . Hyperlipidemia   . Labyrinthitis    recurrent  . Macular degeneration   . Meniere disease   . Prostatitis   . Stroke Robert Gonzales)    Based off of an MRI, MD told pt it seemed as if he had a previous stroke  . Tinnitus    roaring    Past Surgical History:  Procedure Laterality Date  . COLONOSCOPY    . ESOPHAGOGASTRODUODENOSCOPY    . TONSILLECTOMY       Current Outpatient Medications  Medication Sig Dispense Refill  . alum & mag hydroxide-simeth (MAALOX/MYLANTA) 200-200-20 MG/5ML suspension Take 30 mLs by mouth every 6 (six) hours as needed for indigestion or heartburn.    Marland Kitchen aspirin EC 81 MG tablet Take 1 tablet by mouth daily.    . cholecalciferol (VITAMIN D) 1000 units tablet Take 1 tablet by mouth daily.    . diazepam (VALIUM) 2 MG tablet Take 2 mg by mouth.  Take one tablet by mouth  as needed    . Multiple Vitamins-Minerals (PRESERVISION AREDS 2 PO) Take by mouth daily.    . pentoxifylline (TRENTAL) 400 MG CR tablet Take 1 tablet by mouth 2 (two) times daily.    . promethazine (PHENERGAN) 25 MG tablet Take 25 mg by mouth every 6 (six) hours as needed for nausea or vomiting.    . pantoprazole (PROTONIX) 40 MG tablet Take 1 tablet (40 mg total) by mouth daily before breakfast. (Patient not taking: Reported on 07/23/2018) 90 tablet 3   No current facility-administered medications for this visit.     Allergies:   Sulfa antibiotics    ROS:  Please see the history of present illness.   Otherwise, review of systems are positive for none.   All other systems are reviewed and negative.    PHYSICAL EXAM: VS:  BP (!) 160/78   Pulse 60   Ht 5\' 8"  (1.727 m)   Wt 165 lb (74.8 kg)   BMI 25.09 kg/m  , BMI Body mass index is 25.09 kg/m. GENERAL:  Well appearing NECK:  No jugular venous distention, waveform within normal limits, carotid upstroke brisk and symmetric, no  bruits, no thyromegaly LUNGS:  Clear to auscultation bilaterally CHEST:  Unremarkable HEART:  PMI not displaced or sustained,S1 and S2 within normal limits, no S3, no S4, no clicks, no rubs, no murmurs ABD:  Flat, positive bowel sounds normal in frequency in pitch, no bruits, no rebound, no guarding, no midline pulsatile mass, no hepatomegaly, no splenomegaly EXT:  2 plus pulses throughout, no edema, no cyanosis no clubbing   EKG:  EKG is not ordered today.   Recent Labs: 11/26/2017: ALT 13; BUN 28; Creatinine, Ser 1.16; Hemoglobin 14.7; Platelets 227.0; Potassium 3.9; Sodium 138    Lipid Panel No results found for: CHOL, TRIG, HDL, CHOLHDL, VLDL, LDLCALC, LDLDIRECT    Wt Readings from Last 3 Encounters:  07/23/18 165 lb (74.8 kg)  07/08/18 164 lb (74.4 kg)  06/04/18 164 lb (74.4 kg)      Other studies Reviewed: Additional studies/ records that were reviewed today include:  Echo, event monitor, Lexiscan Myoview. Review of the above records demonstrates:     ASSESSMENT AND PLAN:  ATRIAL FIB:    I did not find this on an event monitor.  I do not see any documented evidence of this and could not commit him to anticoagulation.  He will let me know if he has palpitations in the future.  ABNORMAL EKG:   There was LVH on echo but no ischemia on perfusion study.  No further work up.   NSVT: He had 3 runs of about 6-8 beats each.  Based on this I do not think any further therapy is indicated in these were asymptomatic.  DIZZINESS: This seems to have improved.  No change in therapy.  Current medicines are reviewed at length with the patient today.  The patient does not have concerns regarding medicines.  The following changes have been made:   None  Labs/ tests ordered today include: None  No orders of the defined types were placed in this encounter.    Disposition:   FU with me as needed.    Signed, Rollene Rotunda, MD  07/23/2018 1:49 PM    [facility]

## 2018-07-23 ENCOUNTER — Ambulatory Visit (INDEPENDENT_AMBULATORY_CARE_PROVIDER_SITE_OTHER): Payer: Medicare Other | Admitting: Cardiology

## 2018-07-23 ENCOUNTER — Encounter: Payer: Self-pay | Admitting: Cardiology

## 2018-07-23 VITALS — BP 160/78 | HR 60 | Ht 68.0 in | Wt 165.0 lb

## 2018-07-23 DIAGNOSIS — I472 Ventricular tachycardia: Secondary | ICD-10-CM | POA: Diagnosis not present

## 2018-07-23 DIAGNOSIS — I639 Cerebral infarction, unspecified: Secondary | ICD-10-CM

## 2018-07-23 DIAGNOSIS — R42 Dizziness and giddiness: Secondary | ICD-10-CM | POA: Diagnosis not present

## 2018-07-23 DIAGNOSIS — I4729 Other ventricular tachycardia: Secondary | ICD-10-CM

## 2018-07-23 NOTE — Patient Instructions (Signed)
Medication Instructions:  The current medical regimen is effective;  continue present plan and medications.  Follow-Up: Follow up as needed with Dr Hochrein.  Thank you for choosing [facility]!!     

## 2019-07-01 IMAGING — MR MR HEAD WO/W CM
12 series · 48 of 48 positions shown · IV contrast (15ml Multihance)
Comparison: None.

CLINICAL DATA: Nausea, vomiting and balance disturbance over the
last 6 months.

EXAM:
MRI HEAD WITHOUT AND WITH CONTRAST
TECHNIQUE: Multiplanar, multiecho pulse sequences of the brain and surrounding
structures were obtained without and with intravenous contrast.
CONTRAST:  15mL MULTIHANCE GADOBENATE DIMEGLUMINE 529 MG/ML IV SOLN

[Series 2: t1_se_sag · sagittal · 5.0mm · 0.45mm/px · 1 of 24 slices shown]
[im 1/24]
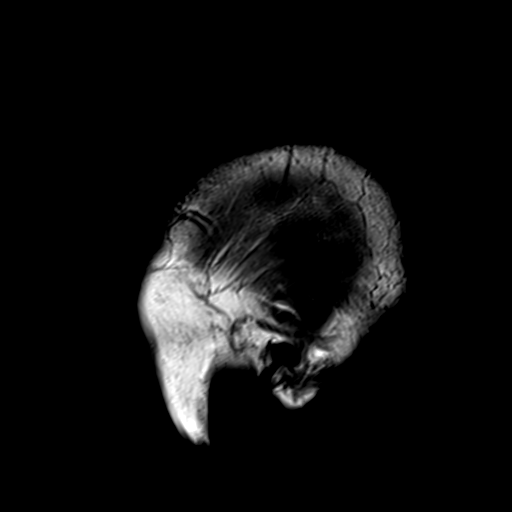

[Series 3: ep2d_diff_(id)_trace · axial · 3.0mm · 1.80mm/px · z∈[-107,+52]mm · 5 of 105 slices shown]
[im 1/105]
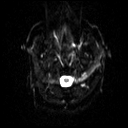
[im 27/105]
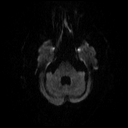
[im 53/105]
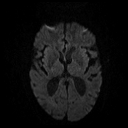
[im 79/105]
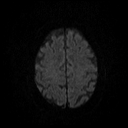
[im 105/105]
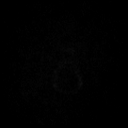

[Series 4: ep2d_diff_(id)_trace_adc · axial · 3.0mm · 1.80mm/px · z∈[-107,+52]mm · 3 of 54 slices shown]
[im 1/54]
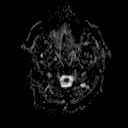
[im 27/54]
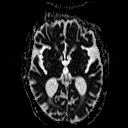
[im 54/54]
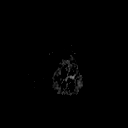

[Series 5: FLAIR · axial · 3.0mm · 0.45mm/px · z∈[-89,+60]mm · 2 of 26 slices shown]
[im 1/26]
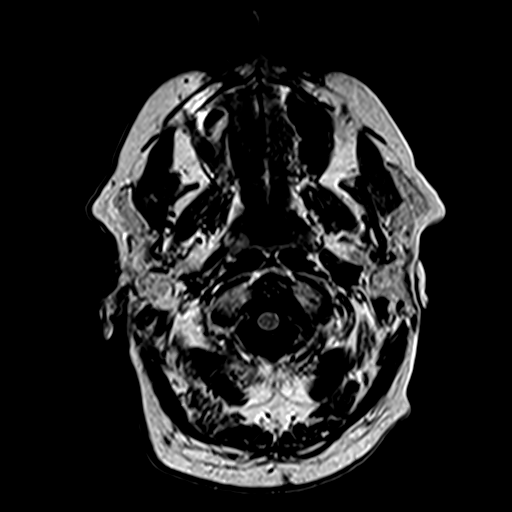
[im 26/26]
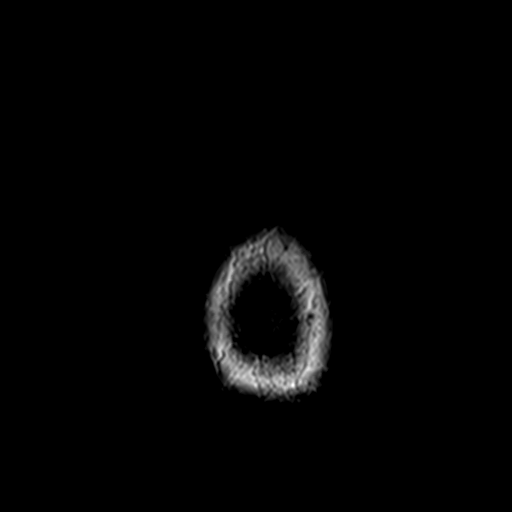

[Series 6: t2_tse_tra · axial · 5.0mm · 0.60mm/px · z∈[-89,+60]mm · 2 of 26 slices shown]
[im 1/26]
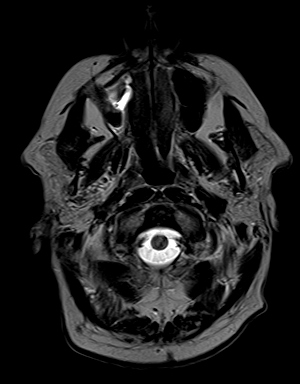
[im 26/26]
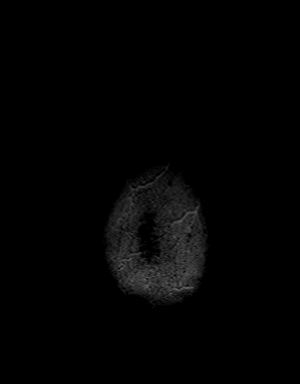

[Series 8: swi_images · axial · 2.0mm · 0.90mm/px · z∈[-93,+64]mm · 5 of 80 slices shown]
[im 1/80]
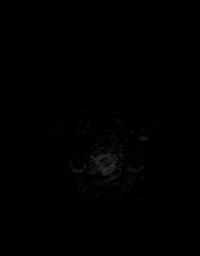
[im 20/80]
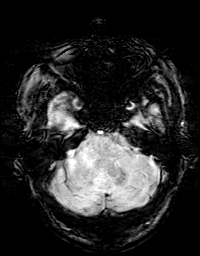
[im 40/80]
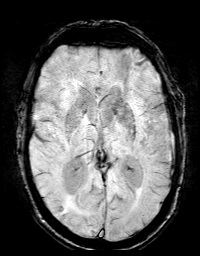
[im 60/80]
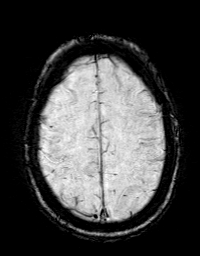
[im 80/80]
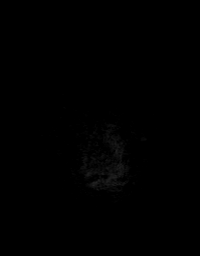

[Series 9: t1_mpr_tra · axial · 1.0mm · 0.72mm/px · z∈[-94,+64]mm · 10 of 160 slices shown]
[im 1/160]
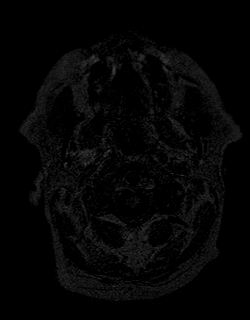
[im 18/160]
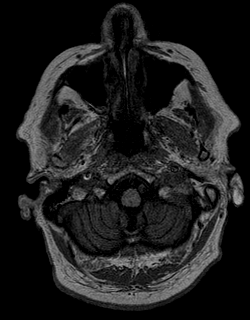
[im 36/160]
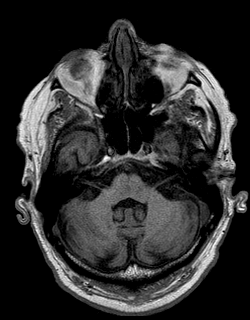
[im 54/160]
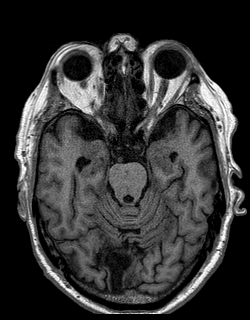
[im 71/160]
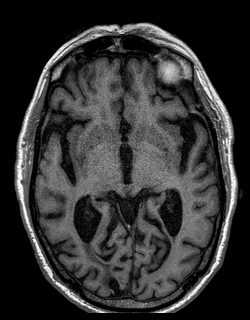
[im 89/160]
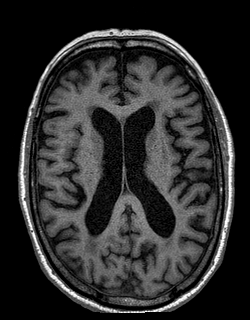
[im 107/160]
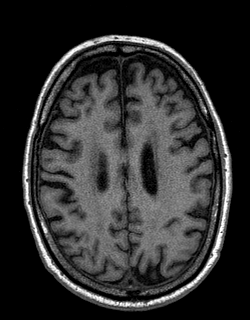
[im 124/160]
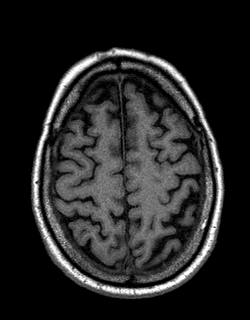
[im 142/160]
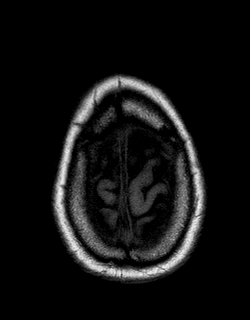
[im 160/160]
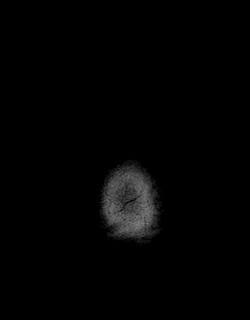

[Series 10: ep2d_diff_cor · coronal · 5.0mm · 1.77mm/px · 4 of 64 slices shown]
[im 1/64]
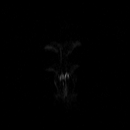
[im 22/64]
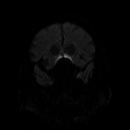
[im 43/64]
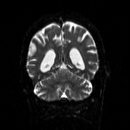
[im 64/64]
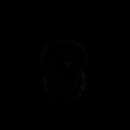

[Series 11: ep2d_diff_cor_adc · coronal · 5.0mm · 1.77mm/px · 2 of 32 slices shown]
[im 1/32]
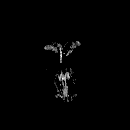
[im 32/32]
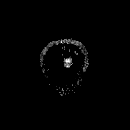

[Series 12: T2 post-contrast · coronal · 5.0mm · 0.45mm/px · 2 of 32 slices shown]
[im 1/32]
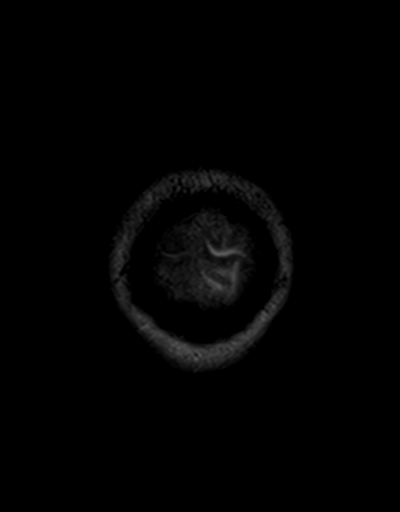
[im 32/32]
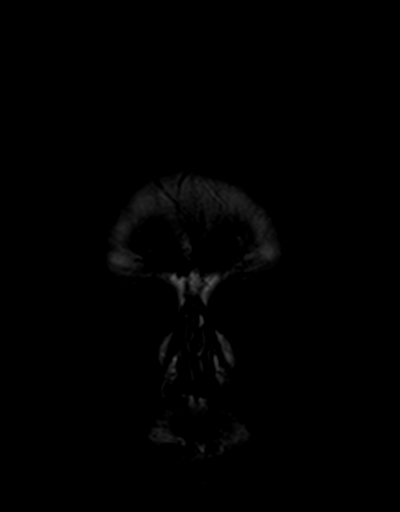

[Series 13: post t1_mpr_tra · axial · 1.0mm · 0.72mm/px · z∈[-94,+64]mm · 10 of 160 slices shown]
[im 1/160]
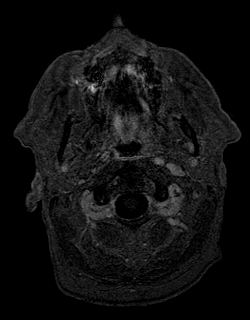
[im 18/160]
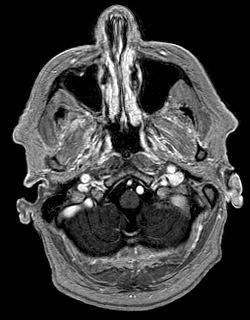
[im 36/160]
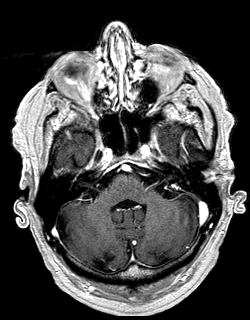
[im 54/160]
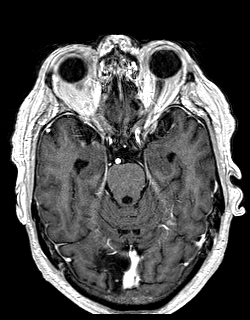
[im 71/160]
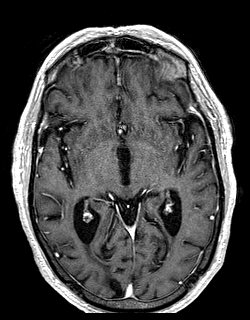
[im 89/160]
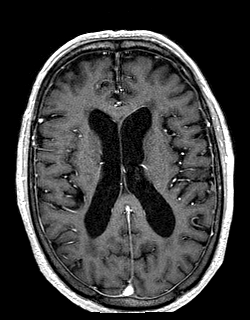
[im 107/160]
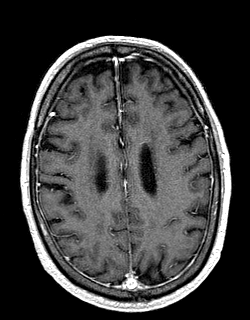
[im 124/160]
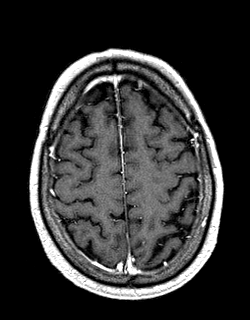
[im 142/160]
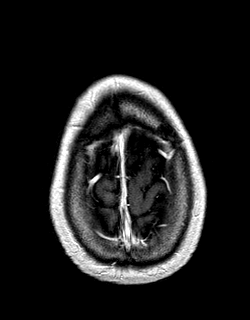
[im 160/160]
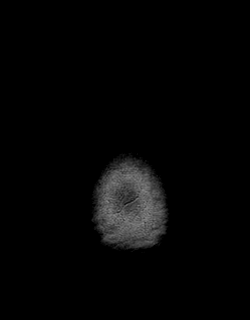

[Series 14: T1 post-contrast · coronal · 5.0mm · 0.72mm/px · 2 of 32 slices shown]
[im 1/32]
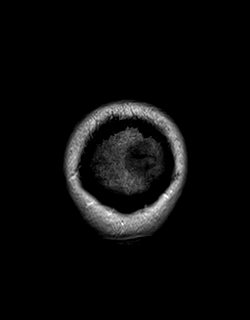
[im 32/32]
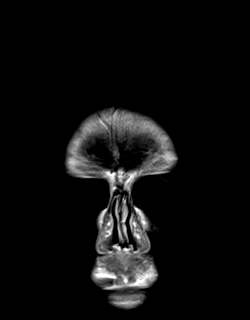

[48 of 48 positions shown; findings below may reference images not displayed]

FINDINGS: Brain: Diffusion imaging does not show any acute or subacute
infarction. There chronic small-vessel ischemic changes affecting
the pons. There are a few old small vessel cerebellar infarctions.
Cerebral hemispheres show generalized atrophy. There is old
infarction in the right occipital lobe which has progressed to
atrophy, encephalomalacia and adjacent gliosis. There are moderate
chronic small-vessel ischemic changes elsewhere affecting the
cerebral hemispheric white matter. No mass lesion, hemorrhage,
hydrocephalus or extra-axial collection. After contrast
administration, no abnormal enhancement occurs. No evidence of
vestibular schwannoma or enhancing neuritis.

Vascular: Major vessels at the base of the brain show flow.

Skull and upper cervical spine: Negative

Sinuses/Orbits: Mild seasonal mucosal inflammatory changes of the
sinuses. Orbits negative

Other: None
IMPRESSION: No specific cause of the presenting symptoms is identified.
Generalized atrophy. Chronic small-vessel ischemic changes
throughout the brain. Old right PCA territory stroke affecting the
right occipital lobe.

## 2019-11-12 ENCOUNTER — Telehealth: Payer: Self-pay | Admitting: Internal Medicine

## 2019-11-12 NOTE — Telephone Encounter (Signed)
Patient woke this morning and bled a significant amount from his rectum and according to his daughter "he didn't pass out but he fell to the floor." He did report to [facility] ED and is still there, the daughter is unaware if he will be transferred to [facility] or [facility]. Follow up appt scheduled with you on 12/07/19 at 2:30 pm since the patient has not been seen since 11/26/17.

## 2019-11-12 NOTE — Telephone Encounter (Signed)
Spoke to daughter [name], one of our former [employer] RNs.  Patient back home all better hemoglobin was 13 in the CT scan showed diverticulosis and the ER doctor attributed his bleeding and near syncope to hemorrhoids.  No further bleeding at this point.  He had been started on Mobic by an orthopedist recently.  They are holding that.   My advice was as follows:  Soft bland diet  Monitor for recurrent bleeding if he has it again in the severe back to the hospital   We will see him at 2 PM tomorrow, Willette Cluster, NP.   [name] is aware and does not need to be contacted   Keep March 1 appointment with me as well at this point

## 2019-11-12 NOTE — Telephone Encounter (Addendum)
Patient's daughter is calling to inform Dr. Leone Payor that the patient is at [facility] ED this morning.

## 2019-11-13 ENCOUNTER — Ambulatory Visit (INDEPENDENT_AMBULATORY_CARE_PROVIDER_SITE_OTHER): Payer: Medicare Other | Admitting: Nurse Practitioner

## 2019-11-13 ENCOUNTER — Other Ambulatory Visit (INDEPENDENT_AMBULATORY_CARE_PROVIDER_SITE_OTHER): Payer: Medicare Other

## 2019-11-13 ENCOUNTER — Other Ambulatory Visit: Payer: Self-pay

## 2019-11-13 ENCOUNTER — Encounter: Payer: Self-pay | Admitting: Nurse Practitioner

## 2019-11-13 VITALS — BP 124/60 | HR 74 | Temp 97.3°F | Ht 68.5 in | Wt 169.0 lb

## 2019-11-13 DIAGNOSIS — K922 Gastrointestinal hemorrhage, unspecified: Secondary | ICD-10-CM

## 2019-11-13 LAB — CBC
HCT: 32.6 % — ABNORMAL LOW (ref 39.0–52.0)
Hemoglobin: 11.2 g/dL — ABNORMAL LOW (ref 13.0–17.0)
MCHC: 34.5 g/dL (ref 30.0–36.0)
MCV: 92 fl (ref 78.0–100.0)
Platelets: 227 10*3/uL (ref 150.0–400.0)
RBC: 3.54 Mil/uL — ABNORMAL LOW (ref 4.22–5.81)
RDW: 12.8 % (ref 11.5–15.5)
WBC: 8.4 10*3/uL (ref 4.0–10.5)

## 2019-11-13 LAB — BASIC METABOLIC PANEL
BUN: 31 mg/dL — ABNORMAL HIGH (ref 6–23)
CO2: 27 mEq/L (ref 19–32)
Calcium: 9.3 mg/dL (ref 8.4–10.5)
Chloride: 107 mEq/L (ref 96–112)
Creatinine, Ser: 1.23 mg/dL (ref 0.40–1.50)
GFR: 55.75 mL/min — ABNORMAL LOW (ref >60.00)
Glucose, Bld: 107 mg/dL — ABNORMAL HIGH (ref 70–99)
Potassium: 4 mEq/L (ref 3.5–5.1)
Sodium: 141 mEq/L (ref 135–145)

## 2019-11-13 NOTE — Patient Instructions (Signed)
If you are age 84 or older, your body mass index should be between 23-30. Your Body mass index is 25.32 kg/m. If this is out of the aforementioned range listed, please consider follow up with your Primary Care Provider.  If you are age 43 or younger, your body mass index should be between 19-25. Your Body mass index is 25.32 kg/m. If this is out of the aformentioned range listed, please consider follow up with your Primary Care Provider.   Your provider has requested that you go to the basement level for lab work before leaving today. Press "B" on the elevator. The lab is located at the first door on the left as you exit the elevator. STAT labs today  Thank you for choosing me and [facility].   Willette Cluster, NP

## 2019-11-13 NOTE — Progress Notes (Signed)
IMPRESSION and PLAN:    86 year old male with GI bleeding.  Suspect diverticular hemorrhage.  He does have hemorrhoids but the amount of blood loss described seems out of proportion to hemorrhoidal bleeding. Patient has been taking Mobic and is on a baby aspirin but I doubt this is a brisk upper GI bleed since he is hemodynamically stable. Scant amount of dark red blood and internal hemorrhoids in vault on anoscopy --Patient feels weak, he is pale.  Will check stat CBC and BMP.  Escorted to lab in wheelchair for safety measures.  Other recommendations pending lab results.  Hopefully he can avoid hospitalization during Covid pandemic  HPI:    Primary GI: Dr. Leone Payor  Chief complaint : Rectal bleeding.   Robert Gonzales is a 86 y.o. male, father of a former [employer] fund[employer], who is here for evaluation of GI bleeding.  At 3:30 AM yesterday patient began having painless hematochezia.  After 3 episode he presented to the ED in [place].  Per daughter [name], hemoglobin was 13 in the ED.  CT scan showed diverticulosis per daugher.  ED doctor attributed bleeding to hemorrhoids.  Patient tells me he had a near syncopal episode when the bleeding started.  Today he has not had any episodes of major bleeding just still passing small amounts of blood in the absence of any bowel movements.  He feels weak.  Patient started Mobic about 3 weeks ago.  He has been taking it every day.  He also takes a daily baby aspirin.  PPI is on home med list, but daughter feels certain that he is taking that as well.  Again no abdominal pain.  No nausea nor vomiting.  He had a screening colonoscopy in 2013 at an outside facility.  There was left-sided and right-sided diverticulosis.  No cancers or polyps.   Review of systems:     No chest pain, no SOB, no fevers, no urinary sx   Past Medical History:  Diagnosis Date  . Barrett's esophagus   . Cataract    Bilateral eyes in 2014  . Diverticulosis    right and left  colon  . Esophageal stricture    with Barrett's epithelium  . GERD (gastroesophageal reflux disease)   . Hyperlipidemia   . Labyrinthitis    recurrent  . Macular degeneration   . Meniere disease   . Prostatitis   . Stroke [facility])    Based off of an MRI, MD told pt it seemed as if he had a previous stroke  . Tinnitus    roaring    Patient's surgical history, family medical history, social history, medications and allergies were all reviewed in Epic   Creatinine clearance cannot be calculated (Patient's most recent lab result is older than the maximum 21 days allowed.)  Current Outpatient Medications  Medication Sig Dispense Refill  . acetaminophen (TYLENOL) 650 MG CR tablet Take 650 mg by mouth every 8 (eight) hours as needed for pain.    Marland Kitchen aspirin EC 81 MG tablet Take 1 tablet by mouth daily.    . cholecalciferol (VITAMIN D) 1000 units tablet Take 1 tablet by mouth daily.    . diazepam (VALIUM) 2 MG tablet Take 2 mg by mouth. Take one tablet by mouth  as needed    . Multiple Vitamins-Minerals (PRESERVISION AREDS 2 PO) Take by mouth daily.    . ondansetron (ZOFRAN) 4 MG tablet Take 4 mg by mouth every 8 (eight)  hours as needed for nausea or vomiting.    . pantoprazole (PROTONIX) 40 MG tablet Take 1 tablet (40 mg total) by mouth daily before breakfast. 90 tablet 3  . promethazine (PHENERGAN) 25 MG tablet Take 25 mg by mouth every 6 (six) hours as needed for nausea or vomiting.     No current facility-administered medications for this visit.    Physical Exam:     BP 124/60   Pulse 74   Temp (!) 97.3 F (36.3 C)   Ht 5' 8.5" (1.74 m)   Wt 169 lb (76.7 kg)   BMI 25.32 kg/m   GENERAL:  Pleasant pale male in NAD PSYCH: : Cooperative, normal affect EENT:  conjunctiva pink, mucous membranes moist, neck supple without masses CARDIAC:  RRR,  no peripheral edema PULM: Normal respiratory effort, lungs CTA bilaterally, no wheezing ABDOMEN:  Nondistended, soft, nontender. No obvious  masses, no hepatomegaly,  normal bowel sounds RECTAL: Small protruding internal hemorrhoid.  On anoscopy there were inflamed internal hemorrhoids.  There was a small amount of dark red blood in vault SKIN:  turgor, no lesions seen Musculoskeletal:  Normal muscle tone, normal strength NEURO: Alert and oriented x 3, no focal neurologic deficits   Tye Savoy , NP 11/13/2019, 2:05 PM

## 2019-11-19 ENCOUNTER — Telehealth: Payer: Self-pay | Admitting: Internal Medicine

## 2019-11-19 NOTE — Telephone Encounter (Signed)
[name] called to let us know that the patient's hemoglobin is at 9.2

## 2019-11-19 NOTE — Telephone Encounter (Signed)
Please send to correct nurse.

## 2019-11-19 NOTE — Telephone Encounter (Signed)
hgb 9.2 No bleeding Some diarrhea Plan to recheck 2/15  If more bleeding needs to go back to hospital  Reviewed w/ daughter via text

## 2019-12-07 ENCOUNTER — Other Ambulatory Visit (INDEPENDENT_AMBULATORY_CARE_PROVIDER_SITE_OTHER): Payer: Medicare Other

## 2019-12-07 ENCOUNTER — Encounter: Payer: Self-pay | Admitting: Internal Medicine

## 2019-12-07 ENCOUNTER — Ambulatory Visit (INDEPENDENT_AMBULATORY_CARE_PROVIDER_SITE_OTHER): Payer: Medicare Other | Admitting: Internal Medicine

## 2019-12-07 VITALS — BP 122/64 | HR 61 | Temp 96.4°F | Ht 68.5 in | Wt 170.0 lb

## 2019-12-07 DIAGNOSIS — K5731 Diverticulosis of large intestine without perforation or abscess with bleeding: Secondary | ICD-10-CM

## 2019-12-07 DIAGNOSIS — D649 Anemia, unspecified: Secondary | ICD-10-CM

## 2019-12-07 LAB — CBC WITH DIFFERENTIAL/PLATELET
Basophils Absolute: 0 10*3/uL (ref 0.0–0.1)
Basophils Relative: 0.4 % (ref 0.0–3.0)
Eosinophils Absolute: 0 10*3/uL (ref 0.0–0.7)
Eosinophils Relative: 0.1 % (ref 0.0–5.0)
HCT: 34.8 % — ABNORMAL LOW (ref 39.0–52.0)
Hemoglobin: 11.5 g/dL — ABNORMAL LOW (ref 13.0–17.0)
Lymphocytes Relative: 10.5 % — ABNORMAL LOW (ref 12.0–46.0)
Lymphs Abs: 1.1 10*3/uL (ref 0.7–4.0)
MCHC: 33.1 g/dL (ref 30.0–36.0)
MCV: 93.1 fl (ref 78.0–100.0)
Monocytes Absolute: 0.4 10*3/uL (ref 0.1–1.0)
Monocytes Relative: 4.3 % (ref 3.0–12.0)
Neutro Abs: 8.6 10*3/uL — ABNORMAL HIGH (ref 1.4–7.7)
Neutrophils Relative %: 84.7 % — ABNORMAL HIGH (ref 43.0–77.0)
Platelets: 269 10*3/uL (ref 150.0–400.0)
RBC: 3.74 Mil/uL — ABNORMAL LOW (ref 4.22–5.81)
RDW: 13.4 % (ref 11.5–15.5)
WBC: 10.2 10*3/uL (ref 4.0–10.5)

## 2019-12-07 LAB — VITAMIN B12: Vitamin B-12: 265 pg/mL (ref 211–911)

## 2019-12-07 LAB — FERRITIN: Ferritin: 40.5 ng/mL (ref 22.0–322.0)

## 2019-12-07 NOTE — Progress Notes (Signed)
Robert Gonzales 86 y.o. August 13, 1931 [id number]  Assessment & Plan:   Encounter Diagnoses  Name Primary?  . Mild anemia Yes  . Diverticulosis of colon with hemorrhage    Balance of evidence points to a resolved GI bleed from colonic diverticulosis. We will not pursue colonoscopy.  CBC Ferritin B12 (fatigue)  Avoid Goody's Needs to limit caffeine otherwise also re: Meniere's  F/U here prn pending lab results and clinical course  XT:KWIO, Alycia Patten, MD   Subjective:   Chief Complaint: GI bleed f/u  HPI 86 yo wm w/ recent GI bleeding suspected diverticular - see note 11/13/2019. After that visit he bled again but not since. Hgb 13 on 2/4, 11 on 2/5, 10.2 on 2/8, 9.2 on 2/10 and 9.8 on 2/16. Daughter [name] RN brings lab results.   Due for second Covid vaccine 3/13  Having back pain and on prednisone dose pack and will start low dose after second Covid vaccination. Meloxicam had been started prior to recent GI bleeding.   He is still fatigued and cannot do 100% of what he could before.   Asking about a Goody's - sometimes he would take one and it would energize him but he has not been using.  Diarrhea 1-2 x a month "a right good while" = years  Colonoscopy last 2013 [facility] - pan diverticulosis Allergies  Allergen Reactions  . Sulfa Antibiotics Nausea Only   Current Meds  Medication Sig  . acetaminophen (TYLENOL) 650 MG CR tablet Take 650 mg by mouth every 8 (eight) hours as needed for pain.  Marland Kitchen aspirin EC 81 MG tablet Take 1 tablet by mouth daily.  . cholecalciferol (VITAMIN D) 1000 units tablet Take 1 tablet by mouth daily.  . diazepam (VALIUM) 2 MG tablet Take 2 mg by mouth. Take one tablet by mouth  as needed  . methylPREDNISolone (MEDROL DOSEPAK) 4 MG TBPK tablet Take by mouth.  . Multiple Vitamins-Minerals (PRESERVISION AREDS 2 PO) Take by mouth daily.  . ondansetron (ZOFRAN) 4 MG tablet Take 4 mg by mouth every 8 (eight) hours as needed for nausea or  vomiting.  . pantoprazole (PROTONIX) 40 MG tablet Take 1 tablet (40 mg total) by mouth daily before breakfast.  . promethazine (PHENERGAN) 25 MG tablet Take 25 mg by mouth every 6 (six) hours as needed for nausea or vomiting.   Past Medical History:  Diagnosis Date  . Barrett's esophagus   . Cataract    Bilateral eyes in 2014  . Diverticulosis    right and left colon  . Esophageal stricture    with Barrett's epithelium  . GERD (gastroesophageal reflux disease)   . Hyperlipidemia   . Labyrinthitis    recurrent  . Macular degeneration   . Meniere disease   . Prostatitis   . Stroke [facility])    Based off of an MRI, MD told pt it seemed as if he had a previous stroke  . Tinnitus    roaring   Past Surgical History:  Procedure Laterality Date  . COLONOSCOPY    . ESOPHAGOGASTRODUODENOSCOPY    . TONSILLECTOMY     Social History   Social History Narrative   Married, retired, 1 daughter - Ursula Beath, RN - [employer]   Worked for [employer]   None to rare EtOH   Not a smoker      Patient is right-handed. He lives with his wife in a one story house with a basement. He drinks one cup of coffee and 2  glasses of tea or soda a day. He is active with yardwork.   family history includes Brain cancer in his brother; Breast cancer in his daughter; Healthy in his brother; High Cholesterol in his daughter; High blood pressure in his daughter, father, and mother; Leukemia in his brother; Melanoma in his daughter and mother.   Review of Systems As above  Objective:   Physical Exam BP 122/64   Pulse 61   Temp (!) 96.4 F (35.8 C)   Ht 5' 8.5" (1.74 m)   Wt 170 lb (77.1 kg)   BMI 25.47 kg/m

## 2019-12-07 NOTE — Patient Instructions (Signed)
  Your provider has requested that you go to the basement level for lab work before leaving today. Press "B" on the elevator. The lab is located at the first door on the left as you exit the elevator.    I appreciate the opportunity to care for you. Carl Gessner, MD, FACG 

## 2019-12-08 ENCOUNTER — Other Ambulatory Visit: Payer: Self-pay | Admitting: *Deleted

## 2019-12-08 DIAGNOSIS — D649 Anemia, unspecified: Secondary | ICD-10-CM

## 2020-02-08 ENCOUNTER — Other Ambulatory Visit: Payer: Self-pay

## 2020-02-08 DIAGNOSIS — D509 Iron deficiency anemia, unspecified: Secondary | ICD-10-CM

## 2020-03-02 ENCOUNTER — Other Ambulatory Visit (INDEPENDENT_AMBULATORY_CARE_PROVIDER_SITE_OTHER): Payer: Medicare Other

## 2020-03-02 DIAGNOSIS — D509 Iron deficiency anemia, unspecified: Secondary | ICD-10-CM | POA: Diagnosis not present

## 2020-03-02 LAB — CBC
HCT: 42.6 % (ref 39.0–52.0)
Hemoglobin: 14.5 g/dL (ref 13.0–17.0)
MCHC: 34.1 g/dL (ref 30.0–36.0)
MCV: 89.6 fl (ref 78.0–100.0)
Platelets: 200 10*3/uL (ref 150.0–400.0)
RBC: 4.76 Mil/uL (ref 4.22–5.81)
RDW: 14.9 % (ref 11.5–15.5)
WBC: 10.1 10*3/uL (ref 4.0–10.5)

## 2020-03-02 LAB — FERRITIN: Ferritin: 38.2 ng/mL (ref 22.0–322.0)

## 2020-06-23 NOTE — Progress Notes (Signed)
Cardiology Office Note   Date:  06/24/2020   ID:  Robert Gonzales, DOB 1931/07/04, MRN [id number]  PCP:  Robert Sawyer, MD  Cardiologist:  Rollene Rotunda, MD EP: None  Chief Complaint  Patient presents with  . Irregular heart rate      History of Present Illness: Robert Gonzales is a 86 y.o. male with PMH of HLD, stroke, GERD, anemia (hx of diverticular GI bleed), and macular degeneration, who presents for irregular heart rates.  He was last evaluated by cardiology at an outpatient visit with Dr. Antoine Poche in 2019 for the evaluation of atrial fibrillation. On review of recent holter monitor, there was no evidence of Afib, though did note some NSVT. He was recommended to notify the office if he began experiencing palpitations. He was noted to have an abnormal EKG and underwent an echocardiogram that showed EF 60-65%, mild concentric LVH, G2DD, no RWMA, mild AI, mild MR, and mild LAE. Stress test 07/2018 showed no ischemia or infarction.   More recently he was noted to have fluctuations in his HR and was recommended to follow-up with cardiology, for which this appointment was arranged.   He presents today for follow-up with his daughter, Robert Gonzales. She reports back in June 2021, he was noted to have an elevated HR in the 170s. He presented to his PCP at that time and EKG was felt to be c/w SVT. He was started on metoprolol 25mg  BID at that time. For the past couple weeks he has noticed DOE and generalized weakness. On review of his home vitals, BP is generally well controlled, though he does have a couple occurrences where his HR was in the 140s. He has no complaints of palpitations, chest pain, SOB at rest, dizziness, lightheadedness, syncope, orthopnea, LE edema, or PND. He is still fairly active and likes to garden, though more recently DOE has limited his activity.     BP 132/80 on my repeat Past Medical History:  Diagnosis Date  . Barrett's esophagus   . Cataract    Bilateral eyes in  2014  . Diverticulosis    right and left colon  . Esophageal stricture    with Barrett's epithelium  . GERD (gastroesophageal reflux disease)   . Hyperlipidemia   . Labyrinthitis    recurrent  . Macular degeneration   . Meniere disease   . Prostatitis   . Stroke [facility])    Based off of an MRI, MD told pt it seemed as if he had a previous stroke  . Tinnitus    roaring    Past Surgical History:  Procedure Laterality Date  . COLONOSCOPY    . ESOPHAGOGASTRODUODENOSCOPY    . TONSILLECTOMY       Current Outpatient Medications  Medication Sig Dispense Refill  . acetaminophen (TYLENOL) 650 MG CR tablet Take 650 mg by mouth every 8 (eight) hours as needed for pain.    . cetirizine (ZYRTEC) 10 MG tablet Take 10 mg by mouth daily.    . cholecalciferol (VITAMIN D) 1000 units tablet Take 1 tablet by mouth daily.    . diazepam (VALIUM) 2 MG tablet Take 2 mg by mouth. Take one tablet by mouth  as needed    . ferrous sulfate 325 (65 FE) MG tablet Take 325 mg by mouth daily with breakfast.    . methylPREDNISolone (MEDROL DOSEPAK) 4 MG TBPK tablet Take by mouth.    . metoprolol tartrate (LOPRESSOR) 25 MG tablet Take 1 tablet (25 mg total) by  mouth 2 (two) times daily. 60 tablet 3  . Multiple Vitamins-Minerals (PRESERVISION AREDS 2 PO) Take by mouth daily.    . Multiple Vitamins-Minerals (PRESERVISION AREDS 2+MULTI VIT PO) Take 1 tablet by mouth daily.    . ondansetron (ZOFRAN) 4 MG tablet Take 4 mg by mouth every 8 (eight) hours as needed for nausea or vomiting.    . pantoprazole (PROTONIX) 40 MG tablet Take 1 tablet (40 mg total) by mouth daily before breakfast. 90 tablet 3  . predniSONE (DELTASONE) 5 MG tablet Take 5 mg by mouth daily.    . promethazine (PHENERGAN) 25 MG tablet Take 25 mg by mouth every 6 (six) hours as needed for nausea or vomiting.    . vitamin B-12 (CYANOCOBALAMIN) 1000 MCG tablet Take 1,000 mcg by mouth daily.    Marland Kitchen apixaban (ELIQUIS) 5 MG TABS tablet Take 1 tablet (5 mg  total) by mouth 2 (two) times daily. 60 tablet 3  . ezetimibe (ZETIA) 10 MG tablet Take 1 tablet (10 mg total) by mouth daily. 90 tablet 3   No current facility-administered medications for this visit.   Facility-Administered Medications Ordered in Other Visits  Medication Dose Route Frequency Provider Last Rate Last Admin  . perflutren lipid microspheres (DEFINITY) IV suspension  1-10 mL Intravenous PRN Rollene Rotunda, MD        Allergies:   Sulfa antibiotics and Other    Social History:  The patient  reports that he has never smoked. He has never used smokeless tobacco. He reports current alcohol use. He reports that he does not use drugs.   Family History:  The patient's family history includes Brain cancer in his brother; Breast cancer in his daughter; Healthy in his brother; High Cholesterol in his daughter; High blood pressure in his daughter, father, and mother; Leukemia in his brother; Melanoma in his daughter and mother.    ROS:  Please see the history of present illness.   Otherwise, review of systems are positive for none.   All other systems are reviewed and negative.    PHYSICAL EXAM: VS:  BP (!) 160/91   Pulse 70   Ht 5' 8.5" (1.74 m)   Wt 165 lb 12.8 oz (75.2 kg)   SpO2 98%   BMI 24.84 kg/m  , BMI Body mass index is 24.84 kg/m. GEN: Well nourished, well developed, in no acute distress HEENT: sclera anicteric Neck: no JVD, carotid bruits, or masses Cardiac: RRR; no murmurs, rubs, or gallops,no edema  Respiratory:  clear to auscultation bilaterally, normal work of breathing GI: soft, nontender, nondistended, + BS MS: no deformity or atrophy Skin: warm and dry, no rash Neuro:  Strength and sensation are intact Psych: euthymic mood, full affect   EKG:  EKG is ordered today. The ekg ordered today demonstrates atrial flutter with 4:1 AV block, non-specific ST-T wave abnormalities in lateral leads (seen on previous), no STE/D.    Recent Labs: 11/13/2019: BUN 31;  Creatinine, Ser 1.23; Potassium 4.0; Sodium 141 03/02/2020: Hemoglobin 14.5; Platelets 200.0    Lipid Panel No results found for: CHOL, TRIG, HDL, CHOLHDL, VLDL, LDLCALC, LDLDIRECT    Wt Readings from Last 3 Encounters:  06/24/20 165 lb 12.8 oz (75.2 kg)  12/07/19 170 lb (77.1 kg)  11/13/19 169 lb (76.7 kg)      Other studies Reviewed: Additional studies/ records that were reviewed today include:   Echocardiogram 06/2018: - Left ventricle: The cavity size was normal. There was mild  concentric hypertrophy. Systolic function  was normal. The  estimated ejection fraction was in the range of 60% to 65%. Wall  motion was normal; there were no regional wall motion  abnormalities. Features are consistent with a pseudonormal left  ventricular filling pattern, with concomitant abnormal relaxation  and increased filling pressure (grade 2 diastolic dysfunction).  - Aortic valve: There was mild regurgitation.  - Mitral valve: Systolic bowing without prolapse. There was mild  regurgitation.  - Left atrium: The atrium was mildly dilated.  - Tricuspid valve: Mild prolapse.   NST 07/2018:  There was no ST segment deviation noted during stress.  The study is normal.  This is a low risk study.  The left ventricular ejection fraction is normal (55-65%).  Nuclear stress EF: 57%.   Normal stress nuclear study with no ischemia or infarction.  Gated ejection fraction 57% with normal wall motion.     ASSESSMENT AND PLAN:  1. New onset atrial flutter with 4:1 AV block: confirmed on EKG today with HR 70. Suspect his episode of "SVT" 03/2020 may have been atrial flutter with RVR  - Will check a TSH - Will update an echocardiogram to evaluate LV function and r/o structural abnormalities  - Will continue metoprolol tartrate 25mg  BID for now as HR/BP are generally well controlled with instructions to take an additional 25mg  for HR persistently >120 bpm - This patients CHA2DS2-VASc  Score and unadjusted Ischemic Stroke Rate (% per year) is equal to at least 2.2 % stroke rate/year from a score of 2 (age >86) - Will cautiously start apixaban 5mg  BID for stroke ppx - he did have a diverticular GI bleed earlier this year. Hgb 14.7 06/20/20.  - Will send referral to EP for ablation vs DCCV consideration.  - Will stop aspirin to minimize bleeding risk  2. Elevated BP without diagnosis of HTN: BP 132/80 on my repeat. Reviewed home BP log which is generally well controlled witih SBP in the 100s-130s.  - Continue metoprolol as above  3. HLD: LDL 135 06/20/20. He reports prior myalgias with statin - Will start zetia 10 mg daily  3. Diverticular GI bleed: occurred earlier this year. Hgb 14.7 on labs 06/20/20. No complaints of hematochezia or melena. We discussed risks/benefits of anticoagulation - Encouraged close monitoring for recurrent bleeding   Current medicines are reviewed at length with the patient today.  The patient does not have concerns regarding medicines.  The following changes have been made:  As above  Labs/ tests ordered today include:   Orders Placed This Encounter  Procedures  . T4, free  . TSH  . Ambulatory referral to Cardiac Electrophysiology  . EKG 12-Lead  . ECHOCARDIOGRAM COMPLETE     Disposition:   FU with myself or Dr. 06/22/20 in 1 month  Signed, 06/22/20, PA-C  06/24/2020 12:34 PM

## 2020-06-24 ENCOUNTER — Encounter: Payer: Self-pay | Admitting: Medical

## 2020-06-24 ENCOUNTER — Other Ambulatory Visit: Payer: Self-pay

## 2020-06-24 ENCOUNTER — Ambulatory Visit (INDEPENDENT_AMBULATORY_CARE_PROVIDER_SITE_OTHER): Payer: Medicare Other | Admitting: Medical

## 2020-06-24 ENCOUNTER — Ambulatory Visit (HOSPITAL_COMMUNITY): Payer: Medicare Other | Attending: Cardiology

## 2020-06-24 VITALS — BP 160/91 | HR 70 | Ht 68.5 in | Wt 165.8 lb

## 2020-06-24 DIAGNOSIS — I480 Paroxysmal atrial fibrillation: Secondary | ICD-10-CM | POA: Insufficient documentation

## 2020-06-24 DIAGNOSIS — E785 Hyperlipidemia, unspecified: Secondary | ICD-10-CM

## 2020-06-24 DIAGNOSIS — Z8719 Personal history of other diseases of the digestive system: Secondary | ICD-10-CM

## 2020-06-24 DIAGNOSIS — I4892 Unspecified atrial flutter: Secondary | ICD-10-CM | POA: Diagnosis not present

## 2020-06-24 DIAGNOSIS — R030 Elevated blood-pressure reading, without diagnosis of hypertension: Secondary | ICD-10-CM | POA: Diagnosis not present

## 2020-06-24 LAB — ECHOCARDIOGRAM COMPLETE
Area-P 1/2: 5.42 cm2
Height: 68.5 in
MV M vel: 5.12 m/s
MV Peak grad: 104.9 mmHg
MV VTI: 0.15 cm2
Radius: 0.7 cm
S' Lateral: 3 cm
Weight: 2652.8 oz

## 2020-06-24 MED ORDER — PERFLUTREN LIPID MICROSPHERE
1.0000 mL | INTRAVENOUS | Status: AC | PRN
  Administered 2020-06-24: 2 mL via INTRAVENOUS

## 2020-06-24 MED ORDER — EZETIMIBE 10 MG PO TABS: 10.0000 mg | ORAL_TABLET | Freq: Every day | ORAL | 3 refills | Status: DC

## 2020-06-24 MED ORDER — APIXABAN 5 MG PO TABS: 5.0000 mg | ORAL_TABLET | Freq: Two times a day (BID) | ORAL | 3 refills | Status: DC

## 2020-06-24 MED ORDER — METOPROLOL TARTRATE 25 MG PO TABS
25.0000 mg | ORAL_TABLET | Freq: Two times a day (BID) | ORAL | 3 refills | Status: DC
Start: 2020-06-24 — End: 2020-07-11

## 2020-06-24 NOTE — Patient Instructions (Signed)
Medication Instructions:  Start Eliquis 5mg ( 1 Tablet Twice Daily) Metoprolol 25 mg (1 tablet Twice Daily) If Heart Rate increase over 120 can take additional As needed. Zetia 10mg  (1 Tablet Daily) *If you need a refill on your cardiac medications before your next appointment, please call your pharmacy*   Lab Work: TSH, Free T4 If you have labs (blood work) drawn today and your tests are completely normal, you will receive your results only by: MyChart Message (if you have MyChart) OR . A paper copy in the mail If you have any lab test that is abnormal or we need to change your treatment, we will call you to review the results.   Testing/Procedures: [address] Your physician has requested that you have an echocardiogram. Echocardiography is a painless test that uses sound waves to create images of your heart. It provides your doctor with information about the size and shape of your heart and how well your heart's chambers and valves are working. This procedure takes approximately one hour. There are no restrictions for this procedure.    Follow-Up: At [facility], you and your health needs are our priority.  As part of our continuing mission to provide you with exceptional heart care, we have created designated Provider Care Teams.  These Care Teams include your primary Cardiologist (physician) and Advanced Practice Providers (APPs -  Physician Assistants and Nurse Practitioners) who all work together to provide you with the care you need, when you need it.   Your next appointment:   1 month(s)  The format for your next appointment:   In Person  Provider:   [place], MD   Other Instructions Referral for Cardiac Electrophysiology

## 2020-06-25 LAB — TSH: TSH: 3.26 u[IU]/mL (ref 0.450–4.500)

## 2020-06-25 LAB — T4, FREE: Free T4: 1.24 ng/dL (ref 0.82–1.77)

## 2020-06-27 ENCOUNTER — Telehealth: Payer: Self-pay | Admitting: Medical

## 2020-06-27 NOTE — Telephone Encounter (Signed)
    [name] would like to speak with Dot Lanes, she said she took her number to call her about pt's echo results.

## 2020-06-27 NOTE — Telephone Encounter (Signed)
Any idea what this is about??  Will route to PA to advise.   Thank you!

## 2020-06-27 NOTE — Telephone Encounter (Signed)
   Called patient's daughter, [name], per patient's request. Echo results reviewed in detail and all questions answered. She will present with the patient for his EP appointment 06/29/20.   Beatriz Stallion, PA-C 06/27/20; 4:05 PM

## 2020-06-29 ENCOUNTER — Institutional Professional Consult (permissible substitution): Payer: Medicare Other | Admitting: Cardiology

## 2020-06-29 NOTE — Progress Notes (Deleted)
Electrophysiology Office Note:    Date:  06/29/2020   ID:  Robert Gonzales, DOB [birth year], MRN [id number]  PCP:  Clinton Sawyer, MD  [facility] Cardiologist:  Rollene Rotunda, MD  [facility] Electrophysiologist:  None   Referring MD: Rollene Rotunda, MD   Chief Complaint: Atrial fibrillation  History of Present Illness:    Robert Gonzales is a 86 y.o. male who presents for an evaluation of atrial fibrillation at the request of Dr. Antoine Poche. Their medical history includes hyperlipidemia, stroke, GERD, anemia secondary to diverticular GI bleed, macular degeneration.  He was last seen in clinic on September 17 by Judy Pimple, PA.  During that visit he reported dyspnea on exertion and generalized weakness for the past few weeks.  His heart rate was in the 140s and EKG confirmed he was in atrial flutter with 4-1 conduction.  The patient and his family mention an episode earlier in the summer 2021 where he was seen in the hospital with a heart rate of 170 which was initially thought to be SVT but could have been atrial flutter with 2-1 conduction.  Past Medical History:  Diagnosis Date  . Barrett's esophagus   . Cataract    Bilateral eyes in 2014  . Diverticulosis    right and left colon  . Esophageal stricture    with Barrett's epithelium  . GERD (gastroesophageal reflux disease)   . Hyperlipidemia   . Labyrinthitis    recurrent  . Macular degeneration   . Meniere disease   . Prostatitis   . Stroke [facility])    Based off of an MRI, MD told pt it seemed as if he had a previous stroke  . Tinnitus    roaring    Past Surgical History:  Procedure Laterality Date  . COLONOSCOPY    . ESOPHAGOGASTRODUODENOSCOPY    . TONSILLECTOMY      Current Medications: No outpatient medications have been marked as taking for the 06/29/20 encounter (Appointment) with Lanier Prude, MD.     Allergies:   Sulfa antibiotics and Other   Social History   Socioeconomic History  . Marital  status: Married    Spouse name: Georgeann Oppenheim  . Number of children: 1  . Years of education: Not on file  . Highest education level: 12th grade  Occupational History  . Occupation: retired  Tobacco Use  . Smoking status: Never Smoker  . Smokeless tobacco: Never Used  Vaping Use  . Vaping Use: Never used  Substance and Sexual Activity  . Alcohol use: Yes    Comment: none to rare  . Drug use: No  . Sexual activity: Not on file  Other Topics Concern  . Not on file  Social History Narrative   Married, retired, 1 daughter - Ursula Beath, RN - [employer]   Worked for [employer]   None to rare EtOH   Not a smoker      Patient is right-handed. He lives with his wife in a one story house with a basement. He drinks one cup of coffee and 2 glasses of tea or soda a day. He is active with yardwork.   Social Determinants of Health   Financial Resource Strain:   . Difficulty of Paying Living Expenses: Not on file  Food Insecurity:   . Worried About Programme researcher, broadcasting/film/video in the Last Year: Not on file  . Ran Out of Food in the Last Year: Not on file  Transportation Needs:   . Lack of  Transportation (Medical): Not on file  . Lack of Transportation (Non-Medical): Not on file  Physical Activity:   . Days of Exercise per Week: Not on file  . Minutes of Exercise per Session: Not on file  Stress:   . Feeling of Stress : Not on file  Social Connections:   . Frequency of Communication with Friends and Family: Not on file  . Frequency of Social Gatherings with Friends and Family: Not on file  . Attends Religious Services: Not on file  . Active Member of Clubs or Organizations: Not on file  . Attends Banker Meetings: Not on file  . Marital Status: Not on file     Family History: The patient's family history includes Brain cancer in his brother; Breast cancer in his daughter; Healthy in his brother; High Cholesterol in his daughter; High blood pressure in his daughter, father, and mother; Leukemia  in his brother; Melanoma in his daughter and mother. There is no history of Colon cancer, Stomach cancer, or Esophageal cancer.  ROS:   Please see the history of present illness.    All other systems reviewed and are negative.  EKGs/Labs/Other Studies Reviewed:    The following studies were reviewed today: ECG  06/24/2020 ECG personally reviewed  Atrial flutter with 4-1 conduction.  Most likely typical although the flutter waves in the 1 separated with by an isoelectric segment.  June 04, 2018 EKG personally reviewed  Sinus bradycardia. Abnormal T wave inversions in the lateral precordium.   EKG:  The ekg ordered today demonstrates ***  Recent Labs: 11/13/2019: BUN 31; Creatinine, Ser 1.23; Potassium 4.0; Sodium 141 03/02/2020: Hemoglobin 14.5; Platelets 200.0 06/24/2020: TSH 3.260  Recent Lipid Panel No results found for: CHOL, TRIG, HDL, CHOLHDL, VLDL, LDLCALC, LDLDIRECT  Physical Exam:    VS:  There were no vitals taken for this visit.    Wt Readings from Last 3 Encounters:  06/24/20 165 lb 12.8 oz (75.2 kg)  12/07/19 170 lb (77.1 kg)  11/13/19 169 lb (76.7 kg)     GEN: *** Well nourished, well developed in no acute distress HEENT: Normal NECK: No JVD; No carotid bruits LYMPHATICS: No lymphadenopathy CARDIAC: ***RRR, no murmurs, rubs, gallops RESPIRATORY:  Clear to auscultation without rales, wheezing or rhonchi  ABDOMEN: Soft, non-tender, non-distended MUSCULOSKELETAL:  No edema; No deformity  SKIN: Warm and dry NEUROLOGIC:  Alert and oriented x 3 PSYCHIATRIC:  Normal affect   ASSESSMENT:    1. Atrial flutter, unspecified type (HCC)   2. History of GI diverticular bleed   3. Cerebrovascular disease    PLAN:    In order of problems listed above:  1. Atrial flutter Appears to be typical atrial flutter.  There is a mention of remote atrial fibrillation surrounding a cataract surgery but I have no documentation of this episode.  CHA2DS2-VASc of 6 for age  and history of stroke.  Was started on apixaban recently and has tolerated this without any bleeding events.***   2. Diverticular GI bleed history    3. Cerebrovascular disease       Medication Adjustments/Labs and Tests Ordered: Current medicines are reviewed at length with the patient today.  Concerns regarding medicines are outlined above.  No orders of the defined types were placed in this encounter.  No orders of the defined types were placed in this encounter.    Signed, Steffanie Dunn, MD, [facility]  06/29/2020 8:39 AM    Electrophysiology [facility]

## 2020-07-10 NOTE — Progress Notes (Signed)
Electrophysiology Office Note:    Date:  07/11/2020   ID:  Robert Gonzales, DOB [birth year], MRN [id number]  PCP:  Clinton Sawyer, MD  [facility] Cardiologist:  Rollene Rotunda, MD  [facility] Electrophysiologist:  None   Referring MD: Rollene Rotunda, MD   Chief Complaint: AF  History of Present Illness:    Robert Gonzales is a 86 y.o. male who presents for an evaluation of AF at the request of Judy Pimple, PA-C. Their medical history includes diverticulosis and diverticular GI bleed, GERD, HLD, stroke. He saw Judy Pimple in clinic 06/24/2020 where he reported dyspnea on exertion and weakness. During that appointment, he was noted to be in atrial flutter with 4:1 AV block. He was started on eliquis during that appointment and referred to EP.  Today he is with his daughter. She tells me that he has been very tired for months which is atypical for him. It is hard for them to correlate the symptoms of the atrial flutter given the unknown duration of the atrial flutter. She tells me that years ago he had an ECG that showed Afib but then he went back into normal rhythm for an unclear amount of time.     Past Medical History:  Diagnosis Date  . Barrett's esophagus   . Cataract    Bilateral eyes in 2014  . Diverticulosis    right and left colon  . Esophageal stricture    with Barrett's epithelium  . GERD (gastroesophageal reflux disease)   . Hyperlipidemia   . Labyrinthitis    recurrent  . Macular degeneration   . Meniere disease   . Prostatitis   . Stroke [facility])    Based off of an MRI, MD told pt it seemed as if he had a previous stroke  . Tinnitus    roaring    Past Surgical History:  Procedure Laterality Date  . COLONOSCOPY    . ESOPHAGOGASTRODUODENOSCOPY    . TONSILLECTOMY      Current Medications: Current Meds  Medication Sig  . acetaminophen (TYLENOL) 650 MG CR tablet Take 650 mg by mouth every 8 (eight) hours as needed for pain.  Marland Kitchen apixaban (ELIQUIS) 5 MG  TABS tablet Take 1 tablet (5 mg total) by mouth 2 (two) times daily.  . cetirizine (ZYRTEC) 10 MG tablet Take 10 mg by mouth daily.  . cholecalciferol (VITAMIN D) 1000 units tablet Take 1 tablet by mouth daily.  . diazepam (VALIUM) 2 MG tablet Take 2 mg by mouth. Take one tablet by mouth  as needed  . diclofenac Sodium (VOLTAREN) 1 % GEL as needed.  . ezetimibe (ZETIA) 10 MG tablet Take 1 tablet (10 mg total) by mouth daily.  . ferrous sulfate 325 (65 FE) MG tablet Take 325 mg by mouth daily with breakfast.  . Multiple Vitamins-Minerals (PRESERVISION AREDS 2+MULTI VIT PO) Take 1 tablet by mouth daily.  . ondansetron (ZOFRAN) 4 MG tablet Take 4 mg by mouth every 8 (eight) hours as needed for nausea or vomiting.  . pantoprazole (PROTONIX) 40 MG tablet Take 1 tablet (40 mg total) by mouth daily before breakfast.  . predniSONE (DELTASONE) 5 MG tablet Take 5 mg by mouth daily.  . promethazine (PHENERGAN) 25 MG tablet Take 25 mg by mouth every 6 (six) hours as needed for nausea or vomiting.  . vitamin B-12 (CYANOCOBALAMIN) 1000 MCG tablet Take 1,000 mcg by mouth daily.  . [DISCONTINUED] metoprolol tartrate (LOPRESSOR) 25 MG tablet Take 1 tablet (25 mg total)  by mouth 2 (two) times daily.     Allergies:   Sulfa antibiotics and Other   Social History   Socioeconomic History  . Marital status: Married    Spouse name: Georgeann Oppenheim  . Number of children: 1  . Years of education: Not on file  . Highest education level: 12th grade  Occupational History  . Occupation: retired  Tobacco Use  . Smoking status: Never Smoker  . Smokeless tobacco: Never Used  Vaping Use  . Vaping Use: Never used  Substance and Sexual Activity  . Alcohol use: Yes    Comment: none to rare  . Drug use: No  . Sexual activity: Not on file  Other Topics Concern  . Not on file  Social History Narrative   Married, retired, 1 daughter - Ursula Beath, RN - [employer]   Worked for [employer]   None to rare EtOH   Not a smoker       Patient is right-handed. He lives with his wife in a one story house with a basement. He drinks one cup of coffee and 2 glasses of tea or soda a day. He is active with yardwork.   Social Determinants of Health   Financial Resource Strain:   . Difficulty of Paying Living Expenses: Not on file  Food Insecurity:   . Worried About Programme researcher, broadcasting/film/video in the Last Year: Not on file  . Ran Out of Food in the Last Year: Not on file  Transportation Needs:   . Lack of Transportation (Medical): Not on file  . Lack of Transportation (Non-Medical): Not on file  Physical Activity:   . Days of Exercise per Week: Not on file  . Minutes of Exercise per Session: Not on file  Stress:   . Feeling of Stress : Not on file  Social Connections:   . Frequency of Communication with Friends and Family: Not on file  . Frequency of Social Gatherings with Friends and Family: Not on file  . Attends Religious Services: Not on file  . Active Member of Clubs or Organizations: Not on file  . Attends Banker Meetings: Not on file  . Marital Status: Not on file     Family History: The patient's family history includes Brain cancer in his brother; Breast cancer in his daughter; Healthy in his brother; High Cholesterol in his daughter; High blood pressure in his daughter, father, and mother; Leukemia in his brother; Melanoma in his daughter and mother. There is no history of Colon cancer, Stomach cancer, or Esophageal cancer.  ROS:   Please see the history of present illness.    All other systems reviewed and are negative.  EKGs/Labs/Other Studies Reviewed:    The following studies were reviewed today: Echo, ecgs  06/24/2020 Echo personally reviewed EF 60-65% Septal hypertrophy RV normal Moderate MR and MS  06/24/2020 ECG personally reviewed Atrial flutter with 4:1 conduction  EKG:  The ekg ordered today demonstrates atrial flutter with variable AV conduction.  Recent Labs: 11/13/2019: BUN 31;  Creatinine, Ser 1.23; Potassium 4.0; Sodium 141 03/02/2020: Hemoglobin 14.5; Platelets 200.0 06/24/2020: TSH 3.260  Recent Lipid Panel No results found for: CHOL, TRIG, HDL, CHOLHDL, VLDL, LDLCALC, LDLDIRECT  Physical Exam:    VS:  BP 130/80   Pulse 78   Ht 5' 8.5" (1.74 m)   Wt 165 lb (74.8 kg)   SpO2 100%   BMI 24.72 kg/m     Wt Readings from Last 3 Encounters:  07/11/20  165 lb (74.8 kg)  06/24/20 165 lb 12.8 oz (75.2 kg)  12/07/19 170 lb (77.1 kg)     GEN:  Well nourished, well developed in no acute distress. Hard of hearing. HEENT: Normal NECK: No JVD; No carotid bruits LYMPHATICS: No lymphadenopathy CARDIAC: irregularly irregular, no murmurs, rubs, gallops RESPIRATORY:  Clear to auscultation without rales, wheezing or rhonchi  ABDOMEN: Soft, non-tender, non-distended MUSCULOSKELETAL:  No edema; No deformity  SKIN: Warm and dry NEUROLOGIC:  Alert and oriented x 3 PSYCHIATRIC:  Normal affect   ASSESSMENT:    1. Atrial flutter, unspecified type (HCC)   2. History of GI diverticular bleed    PLAN:    In order of problems listed above:  1. Atrial flutter Continue eliquis. Plan to attempt cardioversion while on amiodarone to see if his fatigue improves. I would like to use an antiarrhythmic drug to maintain rhythm given his history of concomitant atrial fibrillation. Will target doing this after his ophthalmology procedure. Plan to start amiodarone on August 22, 2020 which will allow him 3 weeks of anticoagulation post eye injection. If he has another invasive procedure which requires a stop in his blood thinner, please reach out to [place] to adjust this timeline. Will do 400mg  PO BID for 10 days and then 200mg  once daily. Will plan for a TEE/DCCV on 09/07/2020 if he remains in atrial flutter.  2. Hx of Diverticular bleed Monitor closely on eliquis   Medication Adjustments/Labs and Tests Ordered: Current medicines are reviewed at length with the patient today.   Concerns regarding medicines are outlined above.  Orders Placed This Encounter  Procedures  . EKG 12-Lead   Meds ordered this encounter  Medications  . metoprolol succinate (TOPROL XL) 25 MG 24 hr tablet    Sig: Take 1 tablet (25 mg total) by mouth daily.    Dispense:  90 tablet    Refill:  3  . amiodarone (PACERONE) 200 MG tablet    Sig: Take 2 tablets (400 mg total) by mouth 2 (two) times daily for 10 days, THEN 1 tablet (200 mg total) daily.    Dispense:  130 tablet    Refill:  3     Signed, , MD, [facility]  07/11/2020 12:21 PM    Electrophysiology [facility]

## 2020-07-11 ENCOUNTER — Encounter: Payer: Self-pay | Admitting: Cardiology

## 2020-07-11 ENCOUNTER — Other Ambulatory Visit: Payer: Self-pay

## 2020-07-11 ENCOUNTER — Ambulatory Visit (INDEPENDENT_AMBULATORY_CARE_PROVIDER_SITE_OTHER): Payer: Medicare Other | Admitting: Cardiology

## 2020-07-11 VITALS — BP 130/80 | HR 78 | Ht 68.5 in | Wt 165.0 lb

## 2020-07-11 DIAGNOSIS — Z8719 Personal history of other diseases of the digestive system: Secondary | ICD-10-CM

## 2020-07-11 DIAGNOSIS — I4892 Unspecified atrial flutter: Secondary | ICD-10-CM

## 2020-07-11 MED ORDER — AMIODARONE HCL 200 MG PO TABS: ORAL_TABLET | ORAL | 3 refills | Status: DC

## 2020-07-11 MED ORDER — METOPROLOL SUCCINATE ER 25 MG PO TB24: 25.0000 mg | ORAL_TABLET | Freq: Every day | ORAL | 3 refills | Status: DC

## 2020-07-11 NOTE — Patient Instructions (Addendum)
Medication Instructions:  Your physician has recommended you make the following change in your medication:   1.  STOP metoprolol tartrate 2.  START taking metoprolol succinate 25 mg-one tablet by mouth daily.  Labwork: None ordered.  Testing/Procedures: None ordered.  Follow-Up: Your physician wants you to follow-up in: 3 months with Dr. Lalla Brothers.      Any Other Special Instructions Will Be Listed Below (If Applicable).  If you need a refill on your cardiac medications before your next appointment, please call your pharmacy.   1.  Resume taking your Eliquis 5 mg on July 25, 2020  2.  On August 22, 2020 you will start taking amiodarone 200 mg-  A.  Take 2 tablets by mouth twice a day for 10 days  B.  After 10 days reduce your amiodarone to one tablet by mouth daily  3.  You will have a TEE/DCCV on September 07, 2020    CARDIOVERSION INSTRUCTIONS:   COVID test--September 05, 2020 at 10:00 am-  You will got to [address] [place] [place], [place] [address].  This is a drive thru test site.Someone will direct you to the appropriate testing line. Stay in your car and someone will be with you shortly.  After your covid test go home and quarantine until the day of your procedure.    You are scheduled for a TEE Cardioversion on September 07, 2020 with Dr. Anne Fu.  Please arrive at the [facility] (Main Entrance A) at [facility]: [address] [place], [place] [address] at 9:30 am   DIET: Nothing to eat or drink after midnight except a sip of water with medications (see medication instructions below)  Medication Instructions: TAKE all your normal morning medications with a sip of water. You will need to continue your anticoagulant after your procedure until you  are told by your  Provider that it is safe to stop   You must have a responsible person to drive you home. Failure to do so could result in cancellation.  Bring your insurance cards.  *Special Note: Every effort  is made to have your procedure done on time. Occasionally there are emergencies that occur at the hospital that may cause delays. Please be patient if a delay does occur.

## 2020-08-01 NOTE — Progress Notes (Signed)
Cardiology Office Note   Date:  08/02/2020   ID:  Robert Gonzales, DOB [birth year], MRN [id number]  PCP:  Clinton Sawyer, MD  Cardiologist:   Rollene Rotunda, MD Referring:  Dr. Leone Payor  Chief Complaint  Patient presents with  . Fatigue      History of Present Illness: Robert Gonzales is a 86 y.o. male who is referred by Dr. Leone Payor for evaluation of atrial fib noted by Dr. Leone Payor.  However, when I reviewed a Holter I could not find evidence of atrial fib.  An echo after my first visit with him demonstrated LVH.    Perfusion study was negative for ischemia.   He did have NSVT on event monitor.   Since I last saw him atrial flutter was confirmed during an office visit with [place] and he has since seen Dr. Lalla Brothers.   The plan was to start amiodarone in mid Nov after an ophthalmology appt followed by TEE/DCCV in early Dec.   He is on Eliquis.    He came today to discuss the plan as his daughter has some reluctance about starting amiodarone.  It turns out he never had to stop Eliquis for an eye procedure that he had so we have been on uninterrupted Eliquis for many weeks.  He still feels fatigued.  He is not having any fluctuations in his heart rate.  He has not had any presyncope or syncope.  He denies any chest pressure, neck or arm discomfort.  He has had no weight gain or edema.  Past Medical History:  Diagnosis Date  . Barrett's esophagus   . Cataract    Bilateral eyes in 2014  . Diverticulosis    right and left colon  . Esophageal stricture    with Barrett's epithelium  . GERD (gastroesophageal reflux disease)   . Hyperlipidemia   . Labyrinthitis    recurrent  . Macular degeneration   . Meniere disease   . Prostatitis   . Stroke [facility])    Based off of an MRI, MD told pt it seemed as if he had a previous stroke  . Tinnitus    roaring    Past Surgical History:  Procedure Laterality Date  . COLONOSCOPY    . ESOPHAGOGASTRODUODENOSCOPY    . TONSILLECTOMY       Current  Outpatient Medications  Medication Sig Dispense Refill  . acetaminophen (TYLENOL) 650 MG CR tablet Take 650 mg by mouth every 8 (eight) hours as needed for pain.    Melene Muller ON 08/22/2020] amiodarone (PACERONE) 200 MG tablet Take 2 tablets (400 mg total) by mouth 2 (two) times daily for 10 days, THEN 1 tablet (200 mg total) daily. 130 tablet 3  . apixaban (ELIQUIS) 5 MG TABS tablet Take 1 tablet (5 mg total) by mouth 2 (two) times daily. 60 tablet 3  . cetirizine (ZYRTEC) 10 MG tablet Take 10 mg by mouth daily.    . cholecalciferol (VITAMIN D) 1000 units tablet Take 1 tablet by mouth daily.    . diazepam (VALIUM) 2 MG tablet Take 2 mg by mouth. Take one tablet by mouth  as needed    . diclofenac Sodium (VOLTAREN) 1 % GEL as needed.    . ezetimibe (ZETIA) 10 MG tablet Take 1 tablet (10 mg total) by mouth daily. 90 tablet 3  . ferrous sulfate 325 (65 FE) MG tablet Take 325 mg by mouth daily with breakfast.    . metoprolol succinate (TOPROL XL) 25 MG 24  hr tablet Take 1 tablet (25 mg total) by mouth daily. 90 tablet 3  . Multiple Vitamins-Minerals (PRESERVISION AREDS 2+MULTI VIT PO) Take 1 tablet by mouth daily.    . ondansetron (ZOFRAN) 4 MG tablet Take 4 mg by mouth every 8 (eight) hours as needed for nausea or vomiting.    . pantoprazole (PROTONIX) 40 MG tablet Take 1 tablet (40 mg total) by mouth daily before breakfast. 90 tablet 3  . predniSONE (DELTASONE) 5 MG tablet Take 5 mg by mouth daily.    . promethazine (PHENERGAN) 25 MG tablet Take 25 mg by mouth every 6 (six) hours as needed for nausea or vomiting.    . vitamin B-12 (CYANOCOBALAMIN) 1000 MCG tablet Take 1,000 mcg by mouth daily.     No current facility-administered medications for this visit.    Allergies:   Sulfa antibiotics and Other    ROS:  Please see the history of present illness.   Otherwise, review of systems are positive for none.   All other systems are reviewed and negative.    PHYSICAL EXAM: VS:  BP (!) 150/93    Pulse 79   Temp (!) 95.4 F (35.2 C)   Ht 5\' 8"  (1.727 m)   Wt 167 lb (75.8 kg)   SpO2 95%   BMI 25.39 kg/m  , BMI Body mass index is 25.39 kg/m. GENERAL:  Well appearing NECK:  No jugular venous distention, waveform within normal limits, carotid upstroke brisk and symmetric, no bruits, no thyromegaly LUNGS:  Clear to auscultation bilaterally CHEST:  Unremarkable HEART:  PMI not displaced or sustained,S1 and S2 within normal limits, no S3,  no clicks, no rubs, no murmurs, irregular ABD:  Flat, positive bowel sounds normal in frequency in pitch, no bruits, no rebound, no guarding, no midline pulsatile mass, no hepatomegaly, no splenomegaly EXT:  2 plus pulses throughout, no edema, no cyanosis no clubbing   EKG:  EKG is  ordered today. Atrial fibrillation, rate 79, axis within normal limits, intervals within normal limits, anterolateral T wave inversions unchanged from previous  Recent Labs: 11/13/2019: BUN 31; Creatinine, Ser 1.23; Potassium 4.0; Sodium 141 03/02/2020: Hemoglobin 14.5; Platelets 200.0 06/24/2020: TSH 3.260    Lipid Panel No results found for: CHOL, TRIG, HDL, CHOLHDL, VLDL, LDLCALC, LDLDIRECT    Wt Readings from Last 3 Encounters:  08/02/20 167 lb (75.8 kg)  07/11/20 165 lb (74.8 kg)  06/24/20 165 lb 12.8 oz (75.2 kg)      Other studies Reviewed: Additional studies/ records that were reviewed today include: EP notes Review of the above records demonstrates: See elsewhere   ASSESSMENT AND PLAN:  ATRIAL FLUTTER: I spoke with patient and the daughter and they would prefer cardioversion without amiodarone.  I discussed this with Dr. 06/26/20.  The patient would not need TEE cardioversion as he has been on anticoagulation for greater than 4 weeks uninterrupted.   We will make this arrangement.  ABNORMAL EKG:   There was LVH on echo but no ischemia on perfusion study.  No further work-up.   DIZZINESS:   This did improve with procedure for vertigo although he  still has weakness and does not feel back to his baseline which could be attributable to his arrhythmia which we will address as above.   Current medicines are reviewed at length with the patient today.  The patient does not have concerns regarding medicines.  The following changes have been made:   None   Labs/ tests ordered today include:  None  Orders Placed This Encounter  Procedures  . EKG 12-Lead     Disposition:   FU with me after the DCCV.     Signed, Rollene Rotunda, MD  08/02/2020 5:34 PM    [facility]

## 2020-08-02 ENCOUNTER — Ambulatory Visit: Payer: Medicare Other | Admitting: Cardiology

## 2020-08-02 ENCOUNTER — Other Ambulatory Visit: Payer: Self-pay

## 2020-08-02 ENCOUNTER — Encounter: Payer: Self-pay | Admitting: Cardiology

## 2020-08-02 ENCOUNTER — Ambulatory Visit (INDEPENDENT_AMBULATORY_CARE_PROVIDER_SITE_OTHER): Payer: Medicare Other | Admitting: Cardiology

## 2020-08-02 VITALS — BP 150/93 | HR 79 | Temp 95.4°F | Ht 68.0 in | Wt 167.0 lb

## 2020-08-02 DIAGNOSIS — R42 Dizziness and giddiness: Secondary | ICD-10-CM

## 2020-08-02 DIAGNOSIS — I4892 Unspecified atrial flutter: Secondary | ICD-10-CM

## 2020-08-02 NOTE — Patient Instructions (Signed)
Medication Instructions:  No changes *If you need a refill on your cardiac medications before your next appointment, please call your pharmacy*   Lab Work: None ordered If you have labs (blood work) drawn today and your tests are completely normal, you will receive your results only by: Marland Kitchen MyChart Message (if you have MyChart) OR . A paper copy in the mail If you have any lab test that is abnormal or we need to change your treatment, we will call you to review the results.   Testing/Procedures: None ordered   Follow-Up: At [facility], you and your health needs are our priority.  As part of our continuing mission to provide you with exceptional heart care, we have created designated Provider Care Teams.  These Care Teams include your primary Cardiologist (physician) and Advanced Practice Providers (APPs -  Physician Assistants and Nurse Practitioners) who all work together to provide you with the care you need, when you need it.  We recommend signing up for the patient portal called "MyChart".  Sign up information is provided on this After Visit Summary.  MyChart is used to connect with patients for Virtual Visits (Telemedicine).  Patients are able to view lab/test results, encounter notes, upcoming appointments, etc.  Non-urgent messages can be sent to your provider as well.   To learn more about what you can do with MyChart, go to [email or web address].    Other Instructions Dr. Antoine Poche will reach out to you soon with further instructions for follow up. If you have any questions or concerns in the meantime, do not hesitate to call our office at [phone].

## 2020-08-29 ENCOUNTER — Encounter: Payer: Self-pay | Admitting: Cardiology

## 2020-08-29 ENCOUNTER — Other Ambulatory Visit: Payer: Self-pay

## 2020-08-29 ENCOUNTER — Ambulatory Visit (INDEPENDENT_AMBULATORY_CARE_PROVIDER_SITE_OTHER): Payer: Medicare Other | Admitting: Cardiology

## 2020-08-29 VITALS — BP 128/76 | HR 98 | Ht 68.0 in | Wt 164.6 lb

## 2020-08-29 DIAGNOSIS — I4892 Unspecified atrial flutter: Secondary | ICD-10-CM | POA: Diagnosis not present

## 2020-08-29 DIAGNOSIS — I480 Paroxysmal atrial fibrillation: Secondary | ICD-10-CM

## 2020-08-29 LAB — BASIC METABOLIC PANEL
BUN/Creatinine Ratio: 24 (ref 10–24)
BUN: 33 mg/dL — ABNORMAL HIGH (ref 8–27)
CO2: 28 mmol/L (ref 20–29)
Calcium: 9.9 mg/dL (ref 8.6–10.2)
Chloride: 102 mmol/L (ref 96–106)
Creatinine, Ser: 1.4 mg/dL — ABNORMAL HIGH (ref 0.76–1.27)
GFR calc Af Amer: 52 mL/min/{1.73_m2} — ABNORMAL LOW (ref >59)
GFR calc non Af Amer: 45 mL/min/{1.73_m2} — ABNORMAL LOW (ref >59)
Glucose: 106 mg/dL — ABNORMAL HIGH (ref 65–99)
Potassium: 4.3 mmol/L (ref 3.5–5.2)
Sodium: 141 mmol/L (ref 134–144)

## 2020-08-29 LAB — CBC WITH DIFFERENTIAL/PLATELET
Basophils Absolute: 0 10*3/uL (ref 0.0–0.2)
Basos: 0 %
EOS (ABSOLUTE): 0.1 10*3/uL (ref 0.0–0.4)
Eos: 1 %
Hematocrit: 48 % (ref 37.5–51.0)
Hemoglobin: 16.3 g/dL (ref 13.0–17.7)
Lymphocytes Absolute: 1.6 10*3/uL (ref 0.7–3.1)
Lymphs: 11 %
MCH: 32 pg (ref 26.6–33.0)
MCHC: 34 g/dL (ref 31.5–35.7)
MCV: 94 fL (ref 79–97)
Monocytes Absolute: 1 10*3/uL — ABNORMAL HIGH (ref 0.1–0.9)
Monocytes: 7 %
Neutrophils Absolute: 11.8 10*3/uL — ABNORMAL HIGH (ref 1.4–7.0)
Neutrophils: 81 %
Platelets: 238 10*3/uL (ref 150–450)
RBC: 5.09 x10E6/uL (ref 4.14–5.80)
RDW: 13.6 % (ref 11.6–15.4)
WBC: 14.6 10*3/uL — ABNORMAL HIGH (ref 3.4–10.8)

## 2020-08-29 NOTE — Progress Notes (Signed)
Electrophysiology Office Follow up Visit Note:    Date:  08/29/2020   ID:  Robert Gonzales, DOB [birth year], MRN [id number]  PCP:  Clinton Sawyer, MD  [facility] Cardiologist:  Rollene Rotunda, MD  [facility] Electrophysiologist:  Lanier Prude, MD    Interval History:    Robert Gonzales is a 86 y.o. male who presents for a follow up visit. They were last seen in clinic July 11, 2020.  At that appointment amiodarone was prescribed and the patient was scheduled for cardioversion.  The patient never started the amiodarone given concerns about the potential off target effects.  The patient presents to my clinic today in follow-up prior to his scheduled cardioversion.  He tells me he feels about the same as his last appointment.  He is still fatigued.  No syncope or presyncope.     Past Medical History:  Diagnosis Date  . Barrett's esophagus   . Cataract    Bilateral eyes in 2014  . Diverticulosis    right and left colon  . Esophageal stricture    with Barrett's epithelium  . GERD (gastroesophageal reflux disease)   . Hyperlipidemia   . Labyrinthitis    recurrent  . Macular degeneration   . Meniere disease   . Prostatitis   . Stroke [facility])    Based off of an MRI, MD told pt it seemed as if he had a previous stroke  . Tinnitus    roaring    Past Surgical History:  Procedure Laterality Date  . COLONOSCOPY    . ESOPHAGOGASTRODUODENOSCOPY    . TONSILLECTOMY      Current Medications: Current Meds  Medication Sig  . acetaminophen (TYLENOL) 650 MG CR tablet Take 650 mg by mouth every 8 (eight) hours as needed for pain.  Marland Kitchen apixaban (ELIQUIS) 5 MG TABS tablet Take 1 tablet (5 mg total) by mouth 2 (two) times daily.  . cetirizine (ZYRTEC) 10 MG tablet Take 10 mg by mouth daily.  . cholecalciferol (VITAMIN D) 1000 units tablet Take 1 tablet by mouth daily.  . diazepam (VALIUM) 2 MG tablet Take 2 mg by mouth. Take one tablet by mouth  as needed  . diclofenac Sodium  (VOLTAREN) 1 % GEL as needed.  . ezetimibe (ZETIA) 10 MG tablet Take 1 tablet (10 mg total) by mouth daily.  . ferrous sulfate 325 (65 FE) MG tablet Take 325 mg by mouth daily with breakfast.  . metoprolol succinate (TOPROL XL) 25 MG 24 hr tablet Take 1 tablet (25 mg total) by mouth daily.  . Multiple Vitamins-Minerals (PRESERVISION AREDS 2+MULTI VIT PO) Take 1 tablet by mouth daily.  . ondansetron (ZOFRAN) 4 MG tablet Take 4 mg by mouth every 8 (eight) hours as needed for nausea or vomiting.  . pantoprazole (PROTONIX) 40 MG tablet Take 1 tablet (40 mg total) by mouth daily before breakfast.  . predniSONE (DELTASONE) 5 MG tablet Take 5 mg by mouth daily.  . promethazine (PHENERGAN) 25 MG tablet Take 25 mg by mouth every 6 (six) hours as needed for nausea or vomiting.  . vitamin B-12 (CYANOCOBALAMIN) 1000 MCG tablet Take 1,000 mcg by mouth daily.  . [DISCONTINUED] amiodarone (PACERONE) 200 MG tablet Take 2 tablets (400 mg total) by mouth 2 (two) times daily for 10 days, THEN 1 tablet (200 mg total) daily.     Allergies:   Sulfa antibiotics and Other   Social History   Socioeconomic History  . Marital status: Married  Spouse name: Georgeann Oppenheim  . Number of children: 1  . Years of education: Not on file  . Highest education level: 12th grade  Occupational History  . Occupation: retired  Tobacco Use  . Smoking status: Never Smoker  . Smokeless tobacco: Never Used  Vaping Use  . Vaping Use: Never used  Substance and Sexual Activity  . Alcohol use: Yes    Comment: none to rare  . Drug use: No  . Sexual activity: Not on file  Other Topics Concern  . Not on file  Social History Narrative   Married, retired, 1 daughter - Ursula Beath, RN - [employer]   Worked for [employer]   None to rare EtOH   Not a smoker      Patient is right-handed. He lives with his wife in a one story house with a basement. He drinks one cup of coffee and 2 glasses of tea or soda a day. He is active with yardwork.   Social  Determinants of Health   Financial Resource Strain:   . Difficulty of Paying Living Expenses: Not on file  Food Insecurity:   . Worried About Programme researcher, broadcasting/film/video in the Last Year: Not on file  . Ran Out of Food in the Last Year: Not on file  Transportation Needs:   . Lack of Transportation (Medical): Not on file  . Lack of Transportation (Non-Medical): Not on file  Physical Activity:   . Days of Exercise per Week: Not on file  . Minutes of Exercise per Session: Not on file  Stress:   . Feeling of Stress : Not on file  Social Connections:   . Frequency of Communication with Friends and Family: Not on file  . Frequency of Social Gatherings with Friends and Family: Not on file  . Attends Religious Services: Not on file  . Active Member of Clubs or Organizations: Not on file  . Attends Banker Meetings: Not on file  . Marital Status: Not on file     Family History: The patient's family history includes Brain cancer in his brother; Breast cancer in his daughter; Healthy in his brother; High Cholesterol in his daughter; High blood pressure in his daughter, father, and mother; Leukemia in his brother; Melanoma in his daughter and mother. There is no history of Colon cancer, Stomach cancer, or Esophageal cancer.  ROS:   Please see the history of present illness.    All other systems reviewed and are negative.  EKGs/Labs/Other Studies Reviewed:    The following studies were reviewed today: Prior notes, EKG  EKG:  The ekg ordered today demonstrates atypical appearing atrial flutter with a ventricular rate of 98 bpm.  Recent Labs: 11/13/2019: BUN 31; Creatinine, Ser 1.23; Potassium 4.0; Sodium 141 03/02/2020: Hemoglobin 14.5; Platelets 200.0 06/24/2020: TSH 3.260  Recent Lipid Panel No results found for: CHOL, TRIG, HDL, CHOLHDL, VLDL, LDLCALC, LDLDIRECT  Physical Exam:    VS:  BP 128/76   Pulse 98   Ht 5\' 8"  (1.727 m)   Wt 164 lb 9.6 oz (74.7 kg)   SpO2 99%   BMI  25.03 kg/m     Wt Readings from Last 3 Encounters:  08/29/20 164 lb 9.6 oz (74.7 kg)  08/02/20 167 lb (75.8 kg)  07/11/20 165 lb (74.8 kg)     GEN: Well nourished, well developed in no acute distress HEENT: Normal NECK: No JVD; No carotid bruits LYMPHATICS: No lymphadenopathy CARDIAC: Regular rhythm, no murmurs, rubs, gallops RESPIRATORY:  Clear to auscultation without rales, wheezing or rhonchi  ABDOMEN: Soft, non-tender, non-distended MUSCULOSKELETAL:  No edema; No deformity  SKIN: Warm and dry NEUROLOGIC:  Alert and oriented x 3 PSYCHIATRIC:  Normal affect   ASSESSMENT:    1. Atrial flutter, unspecified type (HCC)   2. PAF (paroxysmal atrial fibrillation) (HCC)    PLAN:    In order of problems listed above:  1. Patient continues to have atrial flutter with elevated ventricular rates near 100 bpm at rest.  He is maintained on Eliquis.  Discussed the pathophysiology of the disease in depth with the patient and his family who is with him today.  We discussed possible outcomes after cardioversion including sustained sinus rhythm versus early return to atrial fibrillation.  While I am concerned that he may have ERAF, I think is very reasonable to proceed directly to cardioversion without using amiodarone.  We will plan to touch base with the patient after his cardioversion.  If patient does have ERAF, can revisit antiarrhythmic choices.   Medication Adjustments/Labs and Tests Ordered: Current medicines are reviewed at length with the patient today.  Concerns regarding medicines are outlined above.  Orders Placed This Encounter  Procedures  . Basic Metabolic Panel (BMET)  . CBC w/Diff  . EKG 12-Lead   No orders of the defined types were placed in this encounter.    Signed, Steffanie Dunn, MD, [facility]  08/29/2020 3:06 PM    Electrophysiology [facility]

## 2020-08-29 NOTE — H&P (View-Only) (Signed)
Electrophysiology Office Follow up Visit Note:    Date:  08/29/2020   ID:  Robert Gonzales, DOB [birth year], MRN [id number]  PCP:  Clinton Sawyer, MD  [facility] Cardiologist:  Rollene Rotunda, MD  [facility] Electrophysiologist:  Lanier Prude, MD    Interval History:    Robert Gonzales is a 86 y.o. male who presents for a follow up visit. They were last seen in clinic July 11, 2020.  At that appointment amiodarone was prescribed and the patient was scheduled for cardioversion.  The patient never started the amiodarone given concerns about the potential off target effects.  The patient presents to my clinic today in follow-up prior to his scheduled cardioversion.  He tells me he feels about the same as his last appointment.  He is still fatigued.  No syncope or presyncope.     Past Medical History:  Diagnosis Date  . Barrett's esophagus   . Cataract    Bilateral eyes in 2014  . Diverticulosis    right and left colon  . Esophageal stricture    with Barrett's epithelium  . GERD (gastroesophageal reflux disease)   . Hyperlipidemia   . Labyrinthitis    recurrent  . Macular degeneration   . Meniere disease   . Prostatitis   . Stroke [facility])    Based off of an MRI, MD told pt it seemed as if he had a previous stroke  . Tinnitus    roaring    Past Surgical History:  Procedure Laterality Date  . COLONOSCOPY    . ESOPHAGOGASTRODUODENOSCOPY    . TONSILLECTOMY      Current Medications: Current Meds  Medication Sig  . acetaminophen (TYLENOL) 650 MG CR tablet Take 650 mg by mouth every 8 (eight) hours as needed for pain.  Marland Kitchen apixaban (ELIQUIS) 5 MG TABS tablet Take 1 tablet (5 mg total) by mouth 2 (two) times daily.  . cetirizine (ZYRTEC) 10 MG tablet Take 10 mg by mouth daily.  . cholecalciferol (VITAMIN D) 1000 units tablet Take 1 tablet by mouth daily.  . diazepam (VALIUM) 2 MG tablet Take 2 mg by mouth. Take one tablet by mouth  as needed  . diclofenac Sodium  (VOLTAREN) 1 % GEL as needed.  . ezetimibe (ZETIA) 10 MG tablet Take 1 tablet (10 mg total) by mouth daily.  . ferrous sulfate 325 (65 FE) MG tablet Take 325 mg by mouth daily with breakfast.  . metoprolol succinate (TOPROL XL) 25 MG 24 hr tablet Take 1 tablet (25 mg total) by mouth daily.  . Multiple Vitamins-Minerals (PRESERVISION AREDS 2+MULTI VIT PO) Take 1 tablet by mouth daily.  . ondansetron (ZOFRAN) 4 MG tablet Take 4 mg by mouth every 8 (eight) hours as needed for nausea or vomiting.  . pantoprazole (PROTONIX) 40 MG tablet Take 1 tablet (40 mg total) by mouth daily before breakfast.  . predniSONE (DELTASONE) 5 MG tablet Take 5 mg by mouth daily.  . promethazine (PHENERGAN) 25 MG tablet Take 25 mg by mouth every 6 (six) hours as needed for nausea or vomiting.  . vitamin B-12 (CYANOCOBALAMIN) 1000 MCG tablet Take 1,000 mcg by mouth daily.  . [DISCONTINUED] amiodarone (PACERONE) 200 MG tablet Take 2 tablets (400 mg total) by mouth 2 (two) times daily for 10 days, THEN 1 tablet (200 mg total) daily.     Allergies:   Sulfa antibiotics and Other   Social History   Socioeconomic History  . Marital status: Married  Spouse name: Georgeann Oppenheim  . Number of children: 1  . Years of education: Not on file  . Highest education level: 12th grade  Occupational History  . Occupation: retired  Tobacco Use  . Smoking status: Never Smoker  . Smokeless tobacco: Never Used  Vaping Use  . Vaping Use: Never used  Substance and Sexual Activity  . Alcohol use: Yes    Comment: none to rare  . Drug use: No  . Sexual activity: Not on file  Other Topics Concern  . Not on file  Social History Narrative   Married, retired, 1 daughter - Ursula Beath, RN - [employer]   Worked for [employer]   None to rare EtOH   Not a smoker      Patient is right-handed. He lives with his wife in a one story house with a basement. He drinks one cup of coffee and 2 glasses of tea or soda a day. He is active with yardwork.   Social  Determinants of Health   Financial Resource Strain:   . Difficulty of Paying Living Expenses: Not on file  Food Insecurity:   . Worried About Programme researcher, broadcasting/film/video in the Last Year: Not on file  . Ran Out of Food in the Last Year: Not on file  Transportation Needs:   . Lack of Transportation (Medical): Not on file  . Lack of Transportation (Non-Medical): Not on file  Physical Activity:   . Days of Exercise per Week: Not on file  . Minutes of Exercise per Session: Not on file  Stress:   . Feeling of Stress : Not on file  Social Connections:   . Frequency of Communication with Friends and Family: Not on file  . Frequency of Social Gatherings with Friends and Family: Not on file  . Attends Religious Services: Not on file  . Active Member of Clubs or Organizations: Not on file  . Attends Banker Meetings: Not on file  . Marital Status: Not on file     Family History: The patient's family history includes Brain cancer in his brother; Breast cancer in his daughter; Healthy in his brother; High Cholesterol in his daughter; High blood pressure in his daughter, father, and mother; Leukemia in his brother; Melanoma in his daughter and mother. There is no history of Colon cancer, Stomach cancer, or Esophageal cancer.  ROS:   Please see the history of present illness.    All other systems reviewed and are negative.  EKGs/Labs/Other Studies Reviewed:    The following studies were reviewed today: Prior notes, EKG  EKG:  The ekg ordered today demonstrates atypical appearing atrial flutter with a ventricular rate of 98 bpm.  Recent Labs: 11/13/2019: BUN 31; Creatinine, Ser 1.23; Potassium 4.0; Sodium 141 03/02/2020: Hemoglobin 14.5; Platelets 200.0 06/24/2020: TSH 3.260  Recent Lipid Panel No results found for: CHOL, TRIG, HDL, CHOLHDL, VLDL, LDLCALC, LDLDIRECT  Physical Exam:    VS:  BP 128/76   Pulse 98   Ht 5\' 8"  (1.727 m)   Wt 164 lb 9.6 oz (74.7 kg)   SpO2 99%   BMI  25.03 kg/m     Wt Readings from Last 3 Encounters:  08/29/20 164 lb 9.6 oz (74.7 kg)  08/02/20 167 lb (75.8 kg)  07/11/20 165 lb (74.8 kg)     GEN: Well nourished, well developed in no acute distress HEENT: Normal NECK: No JVD; No carotid bruits LYMPHATICS: No lymphadenopathy CARDIAC: Regular rhythm, no murmurs, rubs, gallops RESPIRATORY:  Clear to auscultation without rales, wheezing or rhonchi  ABDOMEN: Soft, non-tender, non-distended MUSCULOSKELETAL:  No edema; No deformity  SKIN: Warm and dry NEUROLOGIC:  Alert and oriented x 3 PSYCHIATRIC:  Normal affect   ASSESSMENT:    1. Atrial flutter, unspecified type (HCC)   2. PAF (paroxysmal atrial fibrillation) (HCC)    PLAN:    In order of problems listed above:  1. Patient continues to have atrial flutter with elevated ventricular rates near 100 bpm at rest.  He is maintained on Eliquis.  Discussed the pathophysiology of the disease in depth with the patient and his family who is with him today.  We discussed possible outcomes after cardioversion including sustained sinus rhythm versus early return to atrial fibrillation.  While I am concerned that he may have ERAF, I think is very reasonable to proceed directly to cardioversion without using amiodarone.  We will plan to touch base with the patient after his cardioversion.  If patient does have ERAF, can revisit antiarrhythmic choices.   Medication Adjustments/Labs and Tests Ordered: Current medicines are reviewed at length with the patient today.  Concerns regarding medicines are outlined above.  Orders Placed This Encounter  Procedures  . Basic Metabolic Panel (BMET)  . CBC w/Diff  . EKG 12-Lead   No orders of the defined types were placed in this encounter.    Signed, Steffanie Dunn, MD, [facility]  08/29/2020 3:06 PM    Electrophysiology [facility]

## 2020-08-29 NOTE — Patient Instructions (Addendum)
Medication Instructions:  Your physician recommends that you continue on your current medications as directed. Please refer to the Current Medication list given to you today.  Labwork: You will get lab work today:  BMP and CBC  Testing/Procedures: None ordered.  Follow-Up: Your physician wants you to follow-up in:  October 11, 2020 at 10:30 am with Dr. Lalla Brothers at the [facility] office    CARDIOVERSION INSTRUCTIONS:  COVID test--September 05, 2020 at 10:00 am- You will got to [address] [place], [place] [address]. This is a drive thru test site.Someone will direct you to the appropriate testing line. Stay in your car and someone will be with you shortly. After your covid test go home and quarantine until the day of your procedure.    You are scheduled for a TEE Cardioversion on September 07, 2020 with Dr. Anne Fu.  Please arrive at the [facility] (Main Entrance A) at [facility]: [address] [place], [place] [address] at 9:30 am   DIET: Nothing to eat or drink after midnight except a sip of water with medications (see medication instructions below)  Medication Instructions: TAKE all your normal morning medications with a sip of water. You will need to continue your anticoagulant after your procedure until you            are told by your  Provider that it is safe to stop   You must have a responsible person to drive you home. Failure to do so could result in cancellation.  Bring your insurance cards.  *Special Note: Every effort is made to have your procedure done on time. Occasionally there are emergencies that occur at the hospital that may cause delays. Please be patient if a delay does occur.

## 2020-09-05 ENCOUNTER — Other Ambulatory Visit (HOSPITAL_COMMUNITY)
Admission: RE | Admit: 2020-09-05 | Discharge: 2020-09-05 | Disposition: A | Discharge status: Home / Self Care | Payer: Medicare Other | Source: Ambulatory Visit | Attending: Cardiology | Admitting: Cardiology

## 2020-09-05 DIAGNOSIS — Z20822 Contact with and (suspected) exposure to covid-19: Secondary | ICD-10-CM | POA: Diagnosis not present

## 2020-09-05 DIAGNOSIS — Z01812 Encounter for preprocedural laboratory examination: Secondary | ICD-10-CM | POA: Diagnosis present

## 2020-09-05 LAB — SARS CORONAVIRUS 2 (TAT 6-24 HRS): SARS Coronavirus 2: NEGATIVE

## 2020-09-07 ENCOUNTER — Other Ambulatory Visit: Payer: Self-pay

## 2020-09-07 ENCOUNTER — Ambulatory Visit (HOSPITAL_COMMUNITY): Payer: Medicare Other | Admitting: Anesthesiology

## 2020-09-07 ENCOUNTER — Encounter (HOSPITAL_COMMUNITY)
Admission: RE | Disposition: A | Discharge status: Home / Self Care | Payer: Self-pay | Source: Home / Self Care | Attending: Cardiology

## 2020-09-07 ENCOUNTER — Ambulatory Visit (HOSPITAL_COMMUNITY)
Admission: RE | Admit: 2020-09-07 | Discharge: 2020-09-07 | Disposition: A | Discharge status: Home / Self Care | Payer: Medicare Other | Attending: Cardiology | Admitting: Cardiology

## 2020-09-07 ENCOUNTER — Encounter (HOSPITAL_COMMUNITY): Payer: Self-pay | Admitting: Cardiology

## 2020-09-07 DIAGNOSIS — I480 Paroxysmal atrial fibrillation: Secondary | ICD-10-CM | POA: Diagnosis not present

## 2020-09-07 DIAGNOSIS — Z79899 Other long term (current) drug therapy: Secondary | ICD-10-CM | POA: Insufficient documentation

## 2020-09-07 DIAGNOSIS — Z7901 Long term (current) use of anticoagulants: Secondary | ICD-10-CM | POA: Insufficient documentation

## 2020-09-07 DIAGNOSIS — I4892 Unspecified atrial flutter: Secondary | ICD-10-CM | POA: Diagnosis not present

## 2020-09-07 DIAGNOSIS — I4819 Other persistent atrial fibrillation: Secondary | ICD-10-CM | POA: Diagnosis not present

## 2020-09-07 DIAGNOSIS — Z882 Allergy status to sulfonamides status: Secondary | ICD-10-CM | POA: Diagnosis not present

## 2020-09-07 HISTORY — PX: CARDIOVERSION: SHX1299

## 2020-09-07 SURGERY — CARDIOVERSION
Anesthesia: General

## 2020-09-07 MED ORDER — LIDOCAINE 2% (20 MG/ML) 5 ML SYRINGE
INTRAMUSCULAR | Status: DC | PRN
  Administered 2020-09-07: 80 mg via INTRAVENOUS

## 2020-09-07 MED ORDER — PROPOFOL 10 MG/ML IV BOLUS
INTRAVENOUS | Status: DC | PRN
  Administered 2020-09-07: 80 mg via INTRAVENOUS

## 2020-09-07 MED ORDER — SODIUM CHLORIDE 0.9 % IV SOLN: INTRAVENOUS | Status: DC | PRN

## 2020-09-07 NOTE — Anesthesia Postprocedure Evaluation (Signed)
Anesthesia Post Note  Patient: Robert Gonzales  Procedure(s) Performed: CARDIOVERSION (N/A )     Patient location during evaluation: Endoscopy Anesthesia Type: General Level of consciousness: awake and alert Pain management: pain level controlled Vital Signs Assessment: post-procedure vital signs reviewed and stable Respiratory status: spontaneous breathing, nonlabored ventilation, respiratory function stable and patient connected to nasal cannula oxygen Cardiovascular status: blood pressure returned to baseline and stable Postop Assessment: no apparent nausea or vomiting Anesthetic complications: no   No complications documented.  Last Vitals:  Vitals:   09/07/20 1118 09/07/20 1130  BP: 119/72 (!) 145/86  Pulse: (!) 56 (!) 49  Resp: (!) 22 19  Temp:    SpO2: 96% 98%    Last Pain:  Vitals:   09/07/20 1130  TempSrc:   PainSc: 0-No pain                 Earl Lites P Taheera Thomann

## 2020-09-07 NOTE — Interval H&P Note (Signed)
History and Physical Interval Note:  09/07/2020 10:41 AM  Robert Gonzales  has presented today for surgery, with the diagnosis of AFIB.  The various methods of treatment have been discussed with the patient and family. After consideration of risks, benefits and other options for treatment, the patient has consented to  Procedure(s): CARDIOVERSION (N/A) as a surgical intervention.  The patient's history has been reviewed, patient examined, no change in status, stable for surgery.  I have reviewed the patient's chart and labs.  Questions were answered to the patient's satisfaction.     [employer]

## 2020-09-07 NOTE — Discharge Instructions (Signed)
Electrical Cardioversion Electrical cardioversion is the delivery of a jolt of electricity to restore a normal rhythm to the heart. A rhythm that is too fast or is not regular keeps the heart from pumping well. In this procedure, sticky patches or metal paddles are placed on the chest to deliver electricity to the heart from a device.  What can I expect after the procedure?  Your blood pressure, heart rate, breathing rate, and blood oxygen level will be monitored until you leave the hospital or clinic.  Your heart rhythm will be watched to make sure it does not change.  You may have some redness on the skin where the shocks were given.  Follow these instructions at home:  Do not drive for 24 hours if you were given a sedative during your procedure.  Take over-the-counter and prescription medicines only as told by your health care provider.  Ask your health care provider how to check your pulse. Check it often.  Rest for 48 hours after the procedure or as told by your health care provider.  Avoid or limit your caffeine use as told by your health care provider.  Keep all follow-up visits as told by your health care provider. This is important.  Contact a health care provider if:  You feel like your heart is beating too quickly or your pulse is not regular.  You have a serious muscle cramp that does not go away.  Get help right away if:  You have discomfort in your chest.  You are dizzy or you feel faint.  You have trouble breathing or you are short of breath.  Your speech is slurred.  You have trouble moving an arm or leg on one side of your body.  Your fingers or toes turn cold or blue.  Summary  Electrical cardioversion is the delivery of a jolt of electricity to restore a normal rhythm to the heart.  This procedure may be done right away in an emergency or may be a scheduled procedure if the condition is not an emergency.  Generally, this is a safe  procedure.  After the procedure, check your pulse often as told by your health care provider.  This information is not intended to replace advice given to you by your health care provider. Make sure you discuss any questions you have with your health care provider. Document Revised: 04/27/2019 Document Reviewed: 04/27/2019 Elsevier Patient Education  2020 Elsevier Inc.  

## 2020-09-07 NOTE — Anesthesia Preprocedure Evaluation (Addendum)
Anesthesia Evaluation  Patient identified by MRN, date of birth, ID band Patient awake    Reviewed: Patient's Chart, lab work & pertinent test results  Airway Mallampati: II  TM Distance: >3 FB Neck ROM: Full    Dental  (+) Teeth Intact   Pulmonary neg pulmonary ROS,    Pulmonary exam normal        Cardiovascular negative cardio ROS   Rhythm:Irregular Rate:Normal     Neuro/Psych CVA, No Residual Symptoms negative psych ROS   GI/Hepatic Neg liver ROS, GERD  ,Esophageal stricture   Endo/Other  negative endocrine ROS  Renal/GU negative Renal ROS  negative genitourinary   Musculoskeletal negative musculoskeletal ROS (+)   Abdominal (+)  Abdomen: soft. Bowel sounds: normal.  Peds  Hematology negative hematology ROS (+)   Anesthesia Other Findings   Reproductive/Obstetrics                            Anesthesia Physical Anesthesia Plan  ASA: III  Anesthesia Plan: General   Post-op Pain Management:    Induction: Intravenous  PONV Risk Score and Plan: 2 and Treatment may vary due to age or medical condition  Airway Management Planned: Mask  Additional Equipment: None  Intra-op Plan:   Post-operative Plan:   Informed Consent: I have reviewed the patients History and Physical, chart, labs and discussed the procedure including the risks, benefits and alternatives for the proposed anesthesia with the patient or authorized representative who has indicated his/her understanding and acceptance.     Dental advisory given  Plan Discussed with: CRNA  Anesthesia Plan Comments: (Lab Results      Component                Value               Date                      WBC                      14.6 (H)            08/29/2020                HGB                      16.3                08/29/2020                HCT                      48.0                08/29/2020                MCV                       94                  08/29/2020                PLT                      238                 08/29/2020          )  Anesthesia Quick Evaluation

## 2020-09-07 NOTE — CV Procedure (Signed)
    Electrical Cardioversion Procedure Note Tajae Rybicki [id number] 1931/02/01  Procedure: Electrical Cardioversion Indications:  Atrial Fibrillation  Time Out: Verified patient identification, verified procedure,medications/allergies/relevent history reviewed, required imaging and test results available.  Performed  Procedure Details  The patient was NPO after midnight. Anesthesia was administered at the beside  by Dr. Guy Franco with 80mg  of propofol.  Cardioversion was performed with synchronized biphasic defibrillation via AP pads with 120, 150 joules.  2 attempt(s) were performed.  The patient converted to normal sinus rhythm however there was frequent ectopy, PACs, paroxysmal atrial tachycardia with intermittent sinus rhythm. The patient tolerated the procedure well   IMPRESSION:  Successful cardioversion of atrial fibrillation, however there were periods of paroxysmal atrial tachycardia interspersed between sinus rhythm.    09/07/2020, 11:04 AM

## 2020-09-07 NOTE — Anesthesia Procedure Notes (Signed)
Procedure Name: General with mask airway Date/Time: 09/07/2020 11:03 AM Performed by: Elliot Dally, CRNA Pre-anesthesia Checklist: Patient identified, Emergency Drugs available, Suction available, Patient being monitored and Timeout performed Patient Re-evaluated:Patient Re-evaluated prior to induction Oxygen Delivery Method: Ambu bag Preoxygenation: Pre-oxygenation with 100% oxygen

## 2020-09-07 NOTE — Transfer of Care (Signed)
Immediate Anesthesia Transfer of Care Note  Patient: Robert Gonzales  Procedure(s) Performed: CARDIOVERSION (N/A )  Patient Location: Endoscopy Unit  Anesthesia Type:General  Level of Consciousness: drowsy  Airway & Oxygen Therapy: Patient Spontanous Breathing  Post-op Assessment: Report given to RN and Post -op Vital signs reviewed and stable  Post vital signs: Reviewed and stable  Last Vitals:  Vitals Value Taken Time  BP    Temp    Pulse    Resp    SpO2      Last Pain:  Vitals:   09/07/20 0958  TempSrc: Temporal  PainSc: 0-No pain         Complications: No complications documented.

## 2020-09-08 ENCOUNTER — Encounter (HOSPITAL_COMMUNITY): Payer: Self-pay | Admitting: Cardiology

## 2020-10-10 ENCOUNTER — Other Ambulatory Visit: Payer: Self-pay | Admitting: Cardiology

## 2020-10-10 NOTE — Telephone Encounter (Signed)
Prescription refill request for Eliquis received. Indication: atrial fibrillation Last office visit: 11/21  Lalla Brothers Scr: 1.4  11/21 Age: 87 Weight: 75 kg  Prescription refilled

## 2020-10-11 ENCOUNTER — Ambulatory Visit: Payer: Medicare Other | Admitting: Cardiology

## 2020-10-26 ENCOUNTER — Ambulatory Visit (INDEPENDENT_AMBULATORY_CARE_PROVIDER_SITE_OTHER): Payer: Medicare Other | Admitting: Cardiology

## 2020-10-26 ENCOUNTER — Other Ambulatory Visit: Payer: Self-pay

## 2020-10-26 ENCOUNTER — Encounter: Payer: Self-pay | Admitting: Cardiology

## 2020-10-26 VITALS — BP 124/82 | HR 91 | Ht 68.0 in | Wt 163.0 lb

## 2020-10-26 DIAGNOSIS — I4892 Unspecified atrial flutter: Secondary | ICD-10-CM

## 2020-10-26 DIAGNOSIS — I050 Rheumatic mitral stenosis: Secondary | ICD-10-CM | POA: Diagnosis not present

## 2020-10-26 DIAGNOSIS — I4819 Other persistent atrial fibrillation: Secondary | ICD-10-CM

## 2020-10-26 NOTE — Patient Instructions (Addendum)
Medication Instructions:  Your physician recommends that you continue on your current medications as directed. Please refer to the Current Medication list given to you today.  Labwork: None ordered.  Testing/Procedures: None ordered.  Follow-Up: Your physician wants you to follow-up in: 6 months with Dr. Lambert.   You will receive a reminder letter in the mail two months in advance. If you don't receive a letter, please call our office to schedule the follow-up appointment.   Any Other Special Instructions Will Be Listed Below (If Applicable).  If you need a refill on your cardiac medications before your next appointment, please call your pharmacy.   

## 2020-10-26 NOTE — Progress Notes (Signed)
Electrophysiology Office Follow up Visit Note:    Date:  10/26/2020   ID:  Robert Gonzales, DOB 1931-05-06, MRN [id number]  PCP:  Clinton Sawyer, MD  [facility] Cardiologist:  Rollene Rotunda, MD  [facility] Electrophysiologist:  Lanier Prude, MD    Interval History:    Robert Gonzales is a 87 y.o. male who presents for a follow up visit. They were last seen in clinic August 29, 2020.  He underwent cardioversion on September 07, 2020.  This was initially successful but since his cardioversion he has returned to atrial fibrillation.  He continues to take Eliquis for stroke prophylaxis.  I had previously seen the patient on July 11, 2020.  At that appointment I recommend using amiodarone to help maintain normal rhythm with cardioversion but the patient declined given the possible off target effects. Robert Gonzales is with his daughter today in clinic.  He tells me he has been doing overall well since I last saw him.  He is participating in physical therapy and he thinks this is helping.  His daughter tells me he is fairly active although may struggle with becoming less active as he ages.    Past Medical History:  Diagnosis Date  . Barrett's esophagus   . Cataract    Bilateral eyes in 2014  . Diverticulosis    right and left colon  . Esophageal stricture    with Barrett's epithelium  . GERD (gastroesophageal reflux disease)   . Hyperlipidemia   . Labyrinthitis    recurrent  . Macular degeneration   . Meniere disease   . Prostatitis   . Stroke [facility])    Based off of an MRI, MD told pt it seemed as if he had a previous stroke  . Tinnitus    roaring    Past Surgical History:  Procedure Laterality Date  . CARDIOVERSION N/A 09/07/2020   Procedure: CARDIOVERSION;  Surgeon: Jake Bathe, MD;  Location: [facility] ENDOSCOPY;  Service: Cardiovascular;  Laterality: N/A;  . COLONOSCOPY    . ESOPHAGOGASTRODUODENOSCOPY    . TONSILLECTOMY      Current Medications: Current Meds   Medication Sig  . acetaminophen (TYLENOL) 650 MG CR tablet Take 650 mg by mouth daily.  . cetirizine (ZYRTEC) 10 MG tablet Take 5 mg by mouth at bedtime.   . Cholecalciferol (VITAMIN D) 50 MCG (2000 UT) tablet Take 2,000 Units by mouth daily.   . diazepam (VALIUM) 2 MG tablet Take 1 mg by mouth daily as needed (meniere's disease).   Marland Kitchen diclofenac Sodium (VOLTAREN) 1 % GEL Apply 2 g topically daily as needed (Pain in neck).   Marland Kitchen ELIQUIS 5 MG TABS tablet TAKE ONE TABLET BY MOUTH TWICE DAILY  . ezetimibe (ZETIA) 10 MG tablet Take 1 tablet (10 mg total) by mouth daily.  . ferrous sulfate 325 (65 FE) MG tablet Take 325 mg by mouth daily with breakfast.  . metoprolol succinate (TOPROL XL) 25 MG 24 hr tablet Take 1 tablet (25 mg total) by mouth daily.  . Multiple Vitamins-Minerals (PRESERVISION AREDS 2+MULTI VIT PO) Take 1 tablet by mouth daily.  . ondansetron (ZOFRAN) 4 MG tablet Take 4 mg by mouth every 8 (eight) hours as needed for nausea or vomiting.  . pantoprazole (PROTONIX) 40 MG tablet Take 1 tablet (40 mg total) by mouth daily before breakfast.  . Polyethyl Glycol-Propyl Glycol (SYSTANE) 0.4-0.3 % GEL ophthalmic gel Place 1 application into both eyes daily as needed (Dry eye).  . predniSONE (DELTASONE)  5 MG tablet Take 5 mg by mouth 2 (two) times daily with a meal.  . Triamcinolone Acetonide (NASACORT ALLERGY 24HR NA) Place 1 spray into the nose daily as needed (Congestion).  . vitamin B-12 (CYANOCOBALAMIN) 1000 MCG tablet Take 1,000 mcg by mouth daily.     Allergies:   Sulfa antibiotics   Social History   Socioeconomic History  . Marital status: Married    Spouse name: Georgeann Oppenheim  . Number of children: 1  . Years of education: Not on file  . Highest education level: 12th grade  Occupational History  . Occupation: retired  Tobacco Use  . Smoking status: Never Smoker  . Smokeless tobacco: Never Used  Vaping Use  . Vaping Use: Never used  Substance and Sexual Activity  . Alcohol use:  Yes    Comment: none to rare  . Drug use: No  . Sexual activity: Not on file  Other Topics Concern  . Not on file  Social History Narrative   Married, retired, 1 daughter - Ursula Beath, RN - [employer]   Worked for [employer]   None to rare EtOH   Not a smoker      Patient is right-handed. He lives with his wife in a one story house with a basement. He drinks one cup of coffee and 2 glasses of tea or soda a day. He is active with yardwork.   Social Determinants of Health   Financial Resource Strain: Not on file  Food Insecurity: Not on file  Transportation Needs: Not on file  Physical Activity: Not on file  Stress: Not on file  Social Connections: Not on file     Family History: The patient's family history includes Brain cancer in his brother; Breast cancer in his daughter; Healthy in his brother; High Cholesterol in his daughter; High blood pressure in his daughter, father, and mother; Leukemia in his brother; Melanoma in his daughter and mother. There is no history of Colon cancer, Stomach cancer, or Esophageal cancer.  ROS:   Please see the history of present illness.    All other systems reviewed and are negative.  EKGs/Labs/Other Studies Reviewed:    The following studies were reviewed today:   EKG:  The ekg ordered today demonstrates atrial fibrillation/flutter  Recent Labs: 06/24/2020: TSH 3.260 08/29/2020: BUN 33; Creatinine, Ser 1.40; Hemoglobin 16.3; Platelets 238; Potassium 4.3; Sodium 141  Recent Lipid Panel No results found for: CHOL, TRIG, HDL, CHOLHDL, VLDL, LDLCALC, LDLDIRECT  Physical Exam:    VS:  BP 124/82   Pulse 91   Ht 5\' 8"  (1.727 m)   Wt 163 lb (73.9 kg)   SpO2 96%   BMI 24.78 kg/m     Wt Readings from Last 3 Encounters:  10/26/20 163 lb (73.9 kg)  09/07/20 165 lb 5.5 oz (75 kg)  08/29/20 164 lb 9.6 oz (74.7 kg)     GEN:  Well nourished, well developed in no acute distress HEENT: Normal NECK: No JVD; No carotid bruits LYMPHATICS: No  lymphadenopathy CARDIAC: Irregularly irregular, no murmurs, rubs, gallops RESPIRATORY:  Clear to auscultation without rales, wheezing or rhonchi  ABDOMEN: Soft, non-tender, non-distended MUSCULOSKELETAL:  No edema; No deformity  SKIN: Warm and dry NEUROLOGIC:  Alert and oriented x 3 PSYCHIATRIC:  Normal affect   ASSESSMENT:    1. Atrial flutter, unspecified type (HCC)   2. Persistent atrial fibrillation (HCC)   3. Mitral valve stenosis, unspecified etiology    PLAN:    In order of  problems listed above:  1. Persistent atrial fibrillation and flutter Patient continues to be in atrial fibrillation.  He is fairly well rate controlled in the 90s.  It is difficult to know whether or not he has symptoms related to his atrial fibrillation although the patient tells me with any level of exertion he gives out and feels very winded.  My suspicion is that in the presence of the moderate mitral stenosis, any elevation in his heart rate worsens the gradient across the mitral valve and he becomes symptomatic.  Ideally, would achieve rhythm control and slow his heart rate down although this is proven to be difficult.  My opinion to achieve rhythm control, would need to initiate an antiarrhythmic.  Given his medical comorbidities and the current COVID pandemic, the best option would be to initiate amiodarone.  This is been discussed with the patient and his family in detail.  There is certainly risk to amiodarone therapy including the risks of pulmonary, thyroid, liver toxicities.  Given the patient continues to be active and going to physical therapy, we would like to hold off as long as possible and initiating amiodarone.  We will plan on touching base in about 6 months in clinic to see how he is doing.  In the meantime, he will continue his Eliquis for stroke prophylaxis.  They will reach out via MyChart if he develops worsening symptoms in the interim.  2. moderate mitral stenosis Likely contributing to  some of his symptoms in the setting of atrial fibrillation       Medication Adjustments/Labs and Tests Ordered: Current medicines are reviewed at length with the patient today.  Concerns regarding medicines are outlined above.  Orders Placed This Encounter  Procedures  . EKG 12-Lead   No orders of the defined types were placed in this encounter.    Signed, Steffanie Dunn, MD, [facility]  10/26/2020 2:29 PM    Electrophysiology [facility]

## 2020-11-21 ENCOUNTER — Other Ambulatory Visit: Payer: Self-pay

## 2020-11-21 ENCOUNTER — Other Ambulatory Visit: Payer: Self-pay | Admitting: Pharmacist Clinician (PhC)/ Clinical Pharmacy Specialist

## 2020-11-21 ENCOUNTER — Telehealth: Payer: Self-pay | Admitting: Internal Medicine

## 2020-11-21 DIAGNOSIS — D509 Iron deficiency anemia, unspecified: Secondary | ICD-10-CM

## 2020-11-21 MED ORDER — APIXABAN 5 MG PO TABS
5.0000 mg | ORAL_TABLET | Freq: Two times a day (BID) | ORAL | 5 refills | Status: DC
Start: 2020-11-21 — End: 2020-11-21

## 2020-11-21 MED ORDER — APIXABAN 5 MG PO TABS
5.0000 mg | ORAL_TABLET | Freq: Two times a day (BID) | ORAL | 1 refills | Status: DC
Start: 2020-11-21 — End: 2021-03-24

## 2020-11-21 NOTE — Telephone Encounter (Signed)
Prescription refill request for Eliquis received. Indication:atrial fibrillation Last office visit:1/22 lambert Scr:1.4  11/21 Age: 87 Weight:73.9 kg  Prescription refilled

## 2020-11-21 NOTE — Telephone Encounter (Signed)
Pt's daughter Karen called requesting lab orders for Ferritin and CBC. She would like to know if she can bring pt tomorrow since she has a doctor's appt in [place]. Pls call her at [phone] 

## 2020-11-21 NOTE — Telephone Encounter (Signed)
Please advise on labs.

## 2020-11-21 NOTE — Telephone Encounter (Signed)
89 M 73.9 kg, SCR 1.4

## 2020-11-21 NOTE — Telephone Encounter (Signed)
ORDERS ENTERED AND DAUGHTER NOTIFIED

## 2020-11-22 ENCOUNTER — Telehealth: Payer: Self-pay

## 2020-11-22 NOTE — Telephone Encounter (Signed)
**Note De-Identified Mitsuru Dault Obfuscation** I did a Eliquis PA through covermymeds and received this message:  Robert Gonzales (KeyLyn Hollingshead) Rx #: [id number] Eliquis 5MG  tablets Form: [employer] Electronic PA Form [phone] NCPDP) Determination: N/A Message from Plan: Drug is covered by current benefit plan. No further PA activity needed  I called [facility] and was advised by [facility] that the pts Eliquis RX  it is now going through and that the pts co-pay is now $75/90 day supply. He states that they will fill and will notify the pt that it is ready for pick up.  Trey Paula thanked me for calling them to discuss.

## 2020-12-19 ENCOUNTER — Other Ambulatory Visit (INDEPENDENT_AMBULATORY_CARE_PROVIDER_SITE_OTHER): Payer: Medicare Other

## 2020-12-19 DIAGNOSIS — D509 Iron deficiency anemia, unspecified: Secondary | ICD-10-CM

## 2020-12-19 LAB — FERRITIN: Ferritin: 174.4 ng/mL (ref 22.0–322.0)

## 2020-12-19 LAB — CBC
HCT: 47.7 % (ref 39.0–52.0)
Hemoglobin: 16.3 g/dL (ref 13.0–17.0)
MCHC: 34.2 g/dL (ref 30.0–36.0)
MCV: 95.3 fl (ref 78.0–100.0)
Platelets: 199 10*3/uL (ref 150.0–400.0)
RBC: 5.01 Mil/uL (ref 4.22–5.81)
RDW: 13.8 % (ref 11.5–15.5)
WBC: 13.6 10*3/uL — ABNORMAL HIGH (ref 4.0–10.5)

## 2021-01-25 ENCOUNTER — Ambulatory Visit (INDEPENDENT_AMBULATORY_CARE_PROVIDER_SITE_OTHER): Payer: Medicare Other

## 2021-01-25 ENCOUNTER — Other Ambulatory Visit: Payer: Self-pay

## 2021-01-25 DIAGNOSIS — R42 Dizziness and giddiness: Secondary | ICD-10-CM

## 2021-01-25 DIAGNOSIS — I4892 Unspecified atrial flutter: Secondary | ICD-10-CM

## 2021-01-31 DIAGNOSIS — R42 Dizziness and giddiness: Secondary | ICD-10-CM

## 2021-01-31 DIAGNOSIS — I4892 Unspecified atrial flutter: Secondary | ICD-10-CM | POA: Diagnosis not present

## 2021-03-16 ENCOUNTER — Ambulatory Visit: Payer: Medicare Other | Admitting: Cardiology

## 2021-03-23 NOTE — Progress Notes (Signed)
Electrophysiology Office Follow up Visit Note:    Date:  03/24/2021   ID:  Robert Gonzales, DOB 1931-08-29, MRN [id number]  PCP:  Gardiner Rhyme, MD  [facility] Cardiologist:  Minus Breeding, MD  [facility] Electrophysiologist:  Vickie Epley, MD    Interval History:    Robert Gonzales is a 87 y.o. male who presents for a follow up visit.  I last saw the patient October 26, 2020 for his atrial arrhythmias.  Amiodarone had previously been recommended to the patient but the patient wanted to avoid using the medication given the risk of possible off target effects.   The patient is with his daughter Previously met.  The patient has not been doing well recently.  He is noted to have several falls.  He has lost 20 pounds unintentionally year.  They are concerned about the Eliquis given his falling.    Past Medical History:  Diagnosis Date   Barrett's esophagus    Cataract    Bilateral eyes in 2014   Diverticulosis    right and left colon   Esophageal stricture    with Barrett's epithelium   GERD (gastroesophageal reflux disease)    Hyperlipidemia    Labyrinthitis    recurrent   Macular degeneration    Meniere disease    Prostatitis    Stroke [facility])    Based off of an MRI, MD told pt it seemed as if he had a previous stroke   Tinnitus    roaring    Past Surgical History:  Procedure Laterality Date   CARDIOVERSION N/A 09/07/2020   Procedure: CARDIOVERSION;  Surgeon: Jerline Pain, MD;  Location: [facility] ENDOSCOPY;  Service: Cardiovascular;  Laterality: N/A;   COLONOSCOPY     ESOPHAGOGASTRODUODENOSCOPY     TONSILLECTOMY      Current Medications: Current Meds  Medication Sig   acetaminophen (TYLENOL) 650 MG CR tablet Take 650 mg by mouth daily.   cephALEXin (KEFLEX) 500 MG capsule Take 500 mg by mouth 3 (three) times daily.   cetirizine (ZYRTEC) 10 MG tablet Take 5 mg by mouth at bedtime.    Cholecalciferol (VITAMIN D) 50 MCG (2000 UT) tablet Take 2,000 Units by  mouth daily.    diazepam (VALIUM) 2 MG tablet Take 1 mg by mouth daily as needed (meniere's disease).    diclofenac Sodium (VOLTAREN) 1 % GEL Apply 2 g topically daily as needed (Pain in neck).    ezetimibe (ZETIA) 10 MG tablet Take 1 tablet (10 mg total) by mouth daily.   ferrous sulfate 325 (65 FE) MG tablet Take 325 mg by mouth daily with breakfast.   metoprolol succinate (TOPROL-XL) 50 MG 24 hr tablet Take 1 tablet (50 mg total) by mouth daily. Take with or immediately following a meal.   Multiple Vitamins-Minerals (PRESERVISION AREDS 2+MULTI VIT PO) Take 1 tablet by mouth daily.   ondansetron (ZOFRAN) 4 MG tablet Take 4 mg by mouth every 8 (eight) hours as needed for nausea or vomiting.   pantoprazole (PROTONIX) 40 MG tablet Take 1 tablet (40 mg total) by mouth daily before breakfast.   Polyethyl Glycol-Propyl Glycol (SYSTANE) 0.4-0.3 % GEL ophthalmic gel Place 1 application into both eyes daily as needed (Dry eye).   predniSONE (DELTASONE) 5 MG tablet Take 5 mg by mouth 2 (two) times daily with a meal.   Triamcinolone Acetonide (NASACORT ALLERGY 24HR NA) Place 1 spray into the nose daily as needed (Congestion).   vitamin B-12 (CYANOCOBALAMIN) 1000 MCG tablet  Take 1,000 mcg by mouth daily.   [DISCONTINUED] apixaban (ELIQUIS) 5 MG TABS tablet Take 1 tablet (5 mg total) by mouth 2 (two) times daily.   [DISCONTINUED] metoprolol succinate (TOPROL XL) 25 MG 24 hr tablet Take 1 tablet (25 mg total) by mouth daily.     Allergies:   Sulfa antibiotics   Social History   Socioeconomic History   Marital status: Married    Spouse name: Kitty   Number of children: 1   Years of education: Not on file   Highest education level: 12th grade  Occupational History   Occupation: retired  Tobacco Use   Smoking status: Never   Smokeless tobacco: Never  Vaping Use   Vaping Use: Never used  Substance and Sexual Activity   Alcohol use: Yes    Comment: none to rare   Drug use: No   Sexual activity:  Not on file  Other Topics Concern   Not on file  Social History Narrative   Married, retired, 1 daughter - Johann Capers, RN - [place]   Worked for [employer]   None to rare EtOH   Not a smoker      Patient is right-handed. He lives with his wife in a one story house with a basement. He drinks one cup of coffee and 2 glasses of tea or soda a day. He is active with yardwork.   Social Determinants of Health   Financial Resource Strain: Not on file  Food Insecurity: Not on file  Transportation Needs: Not on file  Physical Activity: Not on file  Stress: Not on file  Social Connections: Not on file     Family History: The patient's family history includes Brain cancer in his brother; Breast cancer in his daughter; Healthy in his brother; High Cholesterol in his daughter; High blood pressure in his daughter, father, and mother; Leukemia in his brother; Melanoma in his daughter and mother. There is no history of Colon cancer, Stomach cancer, or Esophageal cancer.  ROS:   Please see the history of present illness.    All other systems reviewed and are negative.  EKGs/Labs/Other Studies Reviewed:    The following studies were reviewed today:  Feb 19, 2021 ZIO monitor personally reviewed HR 64 - 204, average 100 bpm. Atrial fibrillation 100% of monitoring period 4 VT episodes, longest lasted 4 beats at a rate of 120 bpm. Occasional PVCs, 2%.  EKG:  The ekg ordered today demonstrates atrial fibrillation  Recent Labs: 06/24/2020: TSH 3.260 08/29/2020: BUN 33; Creatinine, Ser 1.40; Potassium 4.3; Sodium 141 12/19/2020: Hemoglobin 16.3; Platelets 199.0  Recent Lipid Panel No results found for: CHOL, TRIG, HDL, CHOLHDL, VLDL, LDLCALC, LDLDIRECT  Physical Exam:    VS:  BP 134/76   Pulse 97   Ht 5' 8" (1.727 m)   Wt 154 lb (69.9 kg)   BMI 23.42 kg/m     Wt Readings from Last 3 Encounters:  03/24/21 154 lb (69.9 kg)  10/26/20 163 lb (73.9 kg)  09/07/20 165 lb 5.5 oz (75 kg)     GEN:  Frail, elderly male in wheelchair HEENT: Normal NECK: No JVD; No carotid bruits LYMPHATICS: No lymphadenopathy CARDIAC: Irregularly irregular, no murmurs, rubs, gallops RESPIRATORY:  Clear to auscultation without rales, wheezing or rhonchi  ABDOMEN: Soft, non-tender, non-distended MUSCULOSKELETAL:  No edema; No deformity  SKIN: Warm and dry NEUROLOGIC:  Alert and oriented x 3 PSYCHIATRIC:  Normal affect   ASSESSMENT:    1. Persistent atrial fibrillation ([place])  2. Atrial flutter, unspecified type ([place])   3. Fall, initial encounter    PLAN:    In order of problems listed above:  Permanent atrial fibrillation Patient is previously been on Eliquis for anticoagulation but given his recent falls the family is expressing concern about the safety of continued anticoagulation.  Given his frailty and increase in falls recently, I do think the risks of staying on anticoagulation outweigh the potential benefits.  I discussed the pros and cons of stopping anticoagulation with the patient his daughter during today's visit.  We discussed the stroke risk of being off anticoagulation.  After long conversation we mutually decided to stop the Eliquis today.  His heart rates are still intermittently elevated while in atrial fibrillation.  We will increase his metoprolol to 50 mg by mouth once daily.  I have told the daughter that if the patient has any more symptoms of dizziness or gait instability with the increased dose of metoprolol they are to decrease it back to 25 mg once daily.  2.  Weight loss, falls I have encouraged him to follow-up with her primary care physician to see if there is any in-home services that can be provided such as physical therapy.  Total time spent with patient today 45 minutes. This includes reviewing records, evaluating the patient and coordinating care.   Medication Adjustments/Labs and Tests Ordered: Current medicines are reviewed at length with the patient today.   Concerns regarding medicines are outlined above.  Orders Placed This Encounter  Procedures   EKG 12-Lead   Meds ordered this encounter  Medications   metoprolol succinate (TOPROL-XL) 50 MG 24 hr tablet    Sig: Take 1 tablet (50 mg total) by mouth daily. Take with or immediately following a meal.    Dispense:  90 tablet    Refill:  3    DOSE INCREASE     Signed, Lars Mage, MD, [facility], [facility] 03/24/2021 9:49 PM    Electrophysiology [facility]

## 2021-03-24 ENCOUNTER — Ambulatory Visit: Payer: Medicare Other | Admitting: Cardiology

## 2021-03-24 ENCOUNTER — Ambulatory Visit (INDEPENDENT_AMBULATORY_CARE_PROVIDER_SITE_OTHER): Payer: Medicare Other | Admitting: Cardiology

## 2021-03-24 ENCOUNTER — Other Ambulatory Visit: Payer: Self-pay

## 2021-03-24 VITALS — BP 134/76 | HR 97 | Ht 68.0 in | Wt 154.0 lb

## 2021-03-24 DIAGNOSIS — I4821 Permanent atrial fibrillation: Secondary | ICD-10-CM

## 2021-03-24 DIAGNOSIS — I4892 Unspecified atrial flutter: Secondary | ICD-10-CM | POA: Diagnosis not present

## 2021-03-24 DIAGNOSIS — R296 Repeated falls: Secondary | ICD-10-CM

## 2021-03-24 DIAGNOSIS — W19XXXA Unspecified fall, initial encounter: Secondary | ICD-10-CM

## 2021-03-24 MED ORDER — METOPROLOL SUCCINATE ER 50 MG PO TB24: 50.0000 mg | ORAL_TABLET | Freq: Every day | ORAL | 3 refills | Status: AC

## 2021-03-24 NOTE — Patient Instructions (Signed)
Medication Instructions:  Your physician has recommended you make the following change in your medication:  1.) Stop Eliquis 2.) Increase metoprolol succinate (Toprol XL) to 50 mg daily  *If you need a refill on your cardiac medications before your next appointment, please call your pharmacy*   Lab Work: NONE If you have labs (blood work) drawn today and your tests are completely normal, you will receive your results only by: MyChart Message (if you have MyChart) OR A paper copy in the mail If you have any lab test that is abnormal or we need to change your treatment, we will call you to review the results.   Testing/Procedures: NONE   Follow-Up: AS NEEDED WITH DR. Lalla Brothers   Other Instructions

## 2021-03-31 ENCOUNTER — Ambulatory Visit: Payer: Medicare Other | Admitting: Cardiology

## 2021-06-09 ENCOUNTER — Other Ambulatory Visit: Payer: Self-pay | Admitting: Cardiology
